# Patient Record
Sex: Male | Born: 1965 | Race: White | Hispanic: No | Marital: Married | Smoking: Current every day smoker
Health system: Southern US, Community
[De-identification: ages and names within clinical notes are randomized; demographics above are authoritative.]

## PROBLEM LIST (undated history)

## (undated) DIAGNOSIS — B182 Chronic viral hepatitis C: Secondary | ICD-10-CM

## (undated) DIAGNOSIS — I219 Acute myocardial infarction, unspecified: Secondary | ICD-10-CM

## (undated) DIAGNOSIS — Z9049 Acquired absence of other specified parts of digestive tract: Secondary | ICD-10-CM

## (undated) DIAGNOSIS — Z006 Encounter for examination for normal comparison and control in clinical research program: Principal | ICD-10-CM

## (undated) HISTORY — DX: Encounter for examination for normal comparison and control in clinical research program: Z00.6

## (undated) HISTORY — PX: CHOLECYSTECTOMY: SHX55

## (undated) HISTORY — DX: Acquired absence of other specified parts of digestive tract: Z90.49

## (undated) HISTORY — PX: MOUTH SURGERY: SHX715

## (undated) HISTORY — DX: Chronic viral hepatitis C: B18.2

---

## 2006-11-29 ENCOUNTER — Emergency Department (HOSPITAL_COMMUNITY): Admission: EM | Admit: 2006-11-29 | Discharge: 2006-11-29 | Payer: Self-pay | Admitting: Emergency Medicine

## 2007-01-09 ENCOUNTER — Emergency Department (HOSPITAL_COMMUNITY): Admission: EM | Admit: 2007-01-09 | Discharge: 2007-01-09 | Payer: Self-pay | Admitting: Emergency Medicine

## 2007-02-06 ENCOUNTER — Emergency Department (HOSPITAL_COMMUNITY): Admission: EM | Admit: 2007-02-06 | Discharge: 2007-02-06 | Payer: Self-pay | Admitting: Emergency Medicine

## 2007-04-25 ENCOUNTER — Emergency Department (HOSPITAL_COMMUNITY): Admission: EM | Admit: 2007-04-25 | Discharge: 2007-04-25 | Payer: Self-pay | Admitting: Emergency Medicine

## 2007-04-30 ENCOUNTER — Emergency Department (HOSPITAL_COMMUNITY): Admission: EM | Admit: 2007-04-30 | Discharge: 2007-04-30 | Payer: Self-pay | Admitting: Emergency Medicine

## 2007-05-08 ENCOUNTER — Emergency Department (HOSPITAL_COMMUNITY): Admission: EM | Admit: 2007-05-08 | Discharge: 2007-05-08 | Payer: Self-pay | Admitting: Emergency Medicine

## 2007-05-31 ENCOUNTER — Emergency Department (HOSPITAL_COMMUNITY): Admission: EM | Admit: 2007-05-31 | Discharge: 2007-06-01 | Payer: Self-pay | Admitting: Emergency Medicine

## 2008-01-13 ENCOUNTER — Emergency Department (HOSPITAL_COMMUNITY): Admission: EM | Admit: 2008-01-13 | Discharge: 2008-01-13 | Payer: Self-pay | Admitting: Emergency Medicine

## 2008-09-13 ENCOUNTER — Emergency Department (HOSPITAL_BASED_OUTPATIENT_CLINIC_OR_DEPARTMENT_OTHER): Admission: EM | Admit: 2008-09-13 | Discharge: 2008-09-13 | Payer: Self-pay | Admitting: Emergency Medicine

## 2008-09-27 ENCOUNTER — Emergency Department (HOSPITAL_COMMUNITY): Admission: EM | Admit: 2008-09-27 | Discharge: 2008-09-27 | Payer: Self-pay | Admitting: Emergency Medicine

## 2008-10-09 ENCOUNTER — Emergency Department (HOSPITAL_COMMUNITY): Admission: EM | Admit: 2008-10-09 | Discharge: 2008-10-09 | Payer: Self-pay | Admitting: Emergency Medicine

## 2011-01-09 LAB — POCT I-STAT, CHEM 8
BUN: 13 mg/dL (ref 6–23)
BUN: 19 mg/dL (ref 6–23)
Chloride: 107 mEq/L (ref 96–112)
Chloride: 108 mEq/L (ref 96–112)
Creatinine, Ser: 1.2 mg/dL (ref 0.4–1.5)
HCT: 48 % (ref 39.0–52.0)
Sodium: 139 mEq/L (ref 135–145)
Sodium: 142 mEq/L (ref 135–145)
TCO2: 23 mmol/L (ref 0–100)
TCO2: 25 mmol/L (ref 0–100)

## 2011-01-09 LAB — DIFFERENTIAL
Eosinophils Absolute: 0.2 10*3/uL (ref 0.0–0.7)
Lymphocytes Relative: 45 % (ref 12–46)
Lymphs Abs: 3.2 10*3/uL (ref 0.7–4.0)
Monocytes Relative: 5 % (ref 3–12)
Neutrophils Relative %: 47 % (ref 43–77)

## 2011-01-09 LAB — CBC
HCT: 47.8 % (ref 39.0–52.0)
MCV: 91.4 fL (ref 78.0–100.0)
RBC: 5.23 MIL/uL (ref 4.22–5.81)
WBC: 7.1 10*3/uL (ref 4.0–10.5)

## 2011-01-09 LAB — LIPASE, BLOOD: Lipase: 195 U/L — ABNORMAL HIGH (ref 11–59)

## 2011-01-09 LAB — HEPATIC FUNCTION PANEL
Albumin: 4 g/dL (ref 3.5–5.2)
Total Protein: 6.9 g/dL (ref 6.0–8.3)

## 2011-06-16 ENCOUNTER — Emergency Department (HOSPITAL_BASED_OUTPATIENT_CLINIC_OR_DEPARTMENT_OTHER)
Admission: EM | Admit: 2011-06-16 | Discharge: 2011-06-16 | Disposition: A | Discharge status: Home / Self Care | Payer: Self-pay | Attending: Emergency Medicine | Admitting: Emergency Medicine

## 2011-06-16 ENCOUNTER — Emergency Department (INDEPENDENT_AMBULATORY_CARE_PROVIDER_SITE_OTHER): Payer: Self-pay

## 2011-06-16 ENCOUNTER — Encounter: Payer: Self-pay | Admitting: *Deleted

## 2011-06-16 DIAGNOSIS — M549 Dorsalgia, unspecified: Secondary | ICD-10-CM | POA: Insufficient documentation

## 2011-06-16 DIAGNOSIS — S6980XA Other specified injuries of unspecified wrist, hand and finger(s), initial encounter: Secondary | ICD-10-CM

## 2011-06-16 DIAGNOSIS — S6990XA Unspecified injury of unspecified wrist, hand and finger(s), initial encounter: Secondary | ICD-10-CM

## 2011-06-16 DIAGNOSIS — X500XXA Overexertion from strenuous movement or load, initial encounter: Secondary | ICD-10-CM | POA: Insufficient documentation

## 2011-06-16 DIAGNOSIS — M79609 Pain in unspecified limb: Secondary | ICD-10-CM

## 2011-06-16 DIAGNOSIS — X58XXXA Exposure to other specified factors, initial encounter: Secondary | ICD-10-CM

## 2011-06-16 DIAGNOSIS — F17200 Nicotine dependence, unspecified, uncomplicated: Secondary | ICD-10-CM | POA: Insufficient documentation

## 2011-06-16 DIAGNOSIS — S62609A Fracture of unspecified phalanx of unspecified finger, initial encounter for closed fracture: Secondary | ICD-10-CM | POA: Insufficient documentation

## 2011-06-16 MED ORDER — HYDROCODONE-ACETAMINOPHEN 5-325 MG PO TABS: 2.0000 | ORAL_TABLET | ORAL | Status: AC | PRN

## 2011-06-16 NOTE — ED Notes (Signed)
Pt c/o lower back pain and right hand pain x 1 day

## 2011-06-16 NOTE — ED Notes (Signed)
Pt states he slipped while on ladder/no fall approx 1/5 hrs PTA-later injured right 5th finger with roofing gun-hx of chronic back pain-hx of fx to pinky finger approx 1 month ago-was wearing splint/not currently and no ortho f/u

## 2011-06-16 NOTE — ED Provider Notes (Signed)
History     CSN: [id number] Arrival date & time: 06/16/2011  2:36 PM  Chief Complaint  Patient presents with  . Back Pain  . Hand Pain    HPI  (Consider location/radiation/quality/duration/timing/severity/associated sxs/prior treatment)  Patient is a 45 y.o. male presenting with hand pain. The history is provided by the patient. No language interpreter was used.  Hand Pain This is a new problem. The current episode started today. The problem occurs constantly. The problem has been gradually worsening. Associated symptoms include joint swelling and myalgias. The symptoms are aggravated by nothing. He has tried nothing for the symptoms. The treatment provided moderate relief.  Pt reports he twist ed his back and hit finger today.  Pt complains of pain with movement of 5th finger.  History reviewed. No pertinent past medical history.  Past Surgical History  Procedure Date  . Cholecystectomy     History reviewed. No pertinent family history.  History  Substance Use Topics  . Smoking status: Current Everyday Smoker -- 0.5 packs/day  . Smokeless tobacco: Not on file  . Alcohol Use: No      Review of Systems  Review of Systems  Musculoskeletal: Positive for myalgias and joint swelling.  All other systems reviewed and are negative.    Allergies  Ultram  Home Medications  No current outpatient prescriptions on file.  Physical Exam    BP 123/92  Pulse 94  Temp 97.1 F (36.2 C)  Resp 16  Ht 6' (1.829 m)  Wt 230 lb (104.327 kg)  BMI 31.19 kg/m2  SpO2 100%  Physical Exam  Nursing note and vitals reviewed. Constitutional: He is oriented to person, place, and time. He appears well-developed and well-nourished.  HENT:  Head: Normocephalic and atraumatic.  Eyes: Pupils are equal, round, and reactive to light.  Neck: Normal range of motion.  Cardiovascular: Normal rate.   Pulmonary/Chest: Effort normal.  Musculoskeletal: He exhibits edema and tenderness.   Tender swollen right 5th finger,  From  LS spine,  From with pain,  Neurological: He is alert and oriented to person, place, and time.  Skin: Skin is warm and dry.  Psychiatric: He has a normal mood and affect.    ED Course  Procedures (including critical care time)  Labs Reviewed - No data to display No results found.   No diagnosis found.   MDM Pt has broken same finger in the past.  He did not follow up as directed for previous break and finger apears deformed.        Langston Masker, [place] 06/16/11 1637  Langston Masker, [place] 06/16/11 [phone]

## 2011-06-25 NOTE — ED Provider Notes (Signed)
Medical screening examination/treatment/procedure(s) were performed by non-physician practitioner and as supervising physician I was immediately available for consultation/collaboration.  Nelia Shi, MD 06/25/11 [phone]

## 2012-05-13 DIAGNOSIS — M25562 Pain in left knee: Secondary | ICD-10-CM | POA: Insufficient documentation

## 2013-10-26 ENCOUNTER — Emergency Department (HOSPITAL_BASED_OUTPATIENT_CLINIC_OR_DEPARTMENT_OTHER)
Admission: EM | Admit: 2013-10-26 | Discharge: 2013-10-26 | Disposition: A | Discharge status: Home / Self Care | Payer: BC Managed Care – PPO | Attending: Emergency Medicine | Admitting: Emergency Medicine

## 2013-10-26 ENCOUNTER — Emergency Department (HOSPITAL_BASED_OUTPATIENT_CLINIC_OR_DEPARTMENT_OTHER): Payer: BC Managed Care – PPO

## 2013-10-26 ENCOUNTER — Encounter (HOSPITAL_BASED_OUTPATIENT_CLINIC_OR_DEPARTMENT_OTHER): Payer: Self-pay | Admitting: Emergency Medicine

## 2013-10-26 DIAGNOSIS — F17200 Nicotine dependence, unspecified, uncomplicated: Secondary | ICD-10-CM | POA: Insufficient documentation

## 2013-10-26 DIAGNOSIS — I10 Essential (primary) hypertension: Secondary | ICD-10-CM | POA: Insufficient documentation

## 2013-10-26 DIAGNOSIS — S20219A Contusion of unspecified front wall of thorax, initial encounter: Secondary | ICD-10-CM | POA: Insufficient documentation

## 2013-10-26 DIAGNOSIS — R296 Repeated falls: Secondary | ICD-10-CM | POA: Insufficient documentation

## 2013-10-26 DIAGNOSIS — Y9373 Activity, racquet and hand sports: Secondary | ICD-10-CM | POA: Insufficient documentation

## 2013-10-26 DIAGNOSIS — Y92838 Other recreation area as the place of occurrence of the external cause: Secondary | ICD-10-CM

## 2013-10-26 DIAGNOSIS — R0602 Shortness of breath: Secondary | ICD-10-CM | POA: Insufficient documentation

## 2013-10-26 DIAGNOSIS — T148XXA Other injury of unspecified body region, initial encounter: Secondary | ICD-10-CM

## 2013-10-26 DIAGNOSIS — Y9239 Other specified sports and athletic area as the place of occurrence of the external cause: Secondary | ICD-10-CM | POA: Insufficient documentation

## 2013-10-26 DIAGNOSIS — R0789 Other chest pain: Secondary | ICD-10-CM

## 2013-10-26 MED ORDER — OXYCODONE-ACETAMINOPHEN 5-325 MG PO TABS
1.0000 | ORAL_TABLET | Freq: Once | ORAL | Status: AC
  Administered 2013-10-26: 1 via ORAL
  Filled 2013-10-26: qty 1

## 2013-10-26 MED ORDER — KETOROLAC TROMETHAMINE 30 MG/ML IJ SOLN
30.0000 mg | Freq: Once | INTRAMUSCULAR | Status: AC
  Administered 2013-10-26: 30 mg via INTRAMUSCULAR
  Filled 2013-10-26: qty 1

## 2013-10-26 MED ORDER — IBUPROFEN 600 MG PO TABS: 600.0000 mg | ORAL_TABLET | Freq: Four times a day (QID) | ORAL | Status: DC | PRN

## 2013-10-26 MED ORDER — OXYCODONE-ACETAMINOPHEN 5-325 MG PO TABS: 1.0000 | ORAL_TABLET | Freq: Four times a day (QID) | ORAL | Status: DC | PRN

## 2013-10-26 NOTE — ED Notes (Signed)
Returned from xray

## 2013-10-26 NOTE — Discharge Instructions (Signed)
Chest Wall Pain Chest wall pain is pain in or around the bones and muscles of your chest. It may take up to 6 weeks to get better. It may take longer if you must stay physically active in your work and activities.  CAUSES  Chest wall pain may happen on its own. However, it may be caused by:  A viral illness like the flu.  Injury.  Coughing.  Exercise.  Arthritis. Fibromyalgia.Contusion A contusion is a deep bruise. Contusions are the result of an injury that caused bleeding under the skin. The contusion may turn blue, purple, or yellow. Minor injuries will give you a painless contusion, but more severe contusions may stay painful and swollen for a few weeks.  CAUSES  A contusion is usually caused by a blow, trauma, or direct force to an area of the body. SYMPTOMS   Swelling and redness of the injured area.  Bruising of the injured area.  Tenderness and soreness of the injured area.  Pain. DIAGNOSIS  The diagnosis can be made by taking a history and physical exam. An X-ray, CT scan, or MRI may be needed to determine if there were any associated injuries, such as fractures. TREATMENT  Specific treatment will depend on what area of the body was injured. In general, the best treatment for a contusion is resting, icing, elevating, and applying cold compresses to the injured area. Over-the-counter medicines may also be recommended for pain control. Ask your caregiver what the best treatment is for your contusion. HOME CARE INSTRUCTIONS   Put ice on the injured area.  Put ice in a plastic bag.  Place a towel between your skin and the bag.  Leave the ice on for 15-20 minutes, 03-04 times a day.  Only take over-the-counter or prescription medicines for pain, discomfort, or fever as directed by your caregiver. Your caregiver may recommend avoiding anti-inflammatory medicines (aspirin, ibuprofen, and naproxen) for 48 hours because these medicines may increase bruising.  Rest the  injured area.  If possible, elevate the injured area to reduce swelling. SEEK IMMEDIATE MEDICAL CARE IF:   You have increased bruising or swelling.  You have pain that is getting worse.  Your swelling or pain is not relieved with medicines. MAKE SURE YOU:   Understand these instructions.  Will watch your condition.  Will get help right away if you are not doing well or get worse. Document Released: 06/21/2005 Document Revised: 12/04/2011 Document Reviewed: 07/17/2011 [facility] Patient Information 2014 [place], [place].    Shingles. HOME CARE INSTRUCTIONS   Avoid overtiring physical activity. Try not to strain or perform activities that cause pain. This includes any activities using your chest or your abdominal and side muscles, especially if heavy weights are used.  Put ice on the sore area.  Put ice in a plastic bag.  Place a towel between your skin and the bag.  Leave the ice on for 15-20 minutes per hour while awake for the first 2 days.  Only take over-the-counter or prescription medicines for pain, discomfort, or fever as directed by your caregiver. SEEK IMMEDIATE MEDICAL CARE IF:   Your pain increases, or you are very uncomfortable.  You have a fever.  Your chest pain becomes worse.  You have new, unexplained symptoms.  You have nausea or vomiting.  You feel sweaty or lightheaded.  You have a cough with phlegm (sputum), or you cough up blood. MAKE SURE YOU:   Understand these instructions.  Will watch your condition.  Will get help  right away if you are not doing well or get worse. Document Released: 09/11/2005 Document Revised: 12/04/2011 Document Reviewed: 05/08/2011 [facility] Patient Information 2014 [place], [place].

## 2013-10-26 NOTE — ED Notes (Signed)
Transported to xray 

## 2013-10-26 NOTE — ED Provider Notes (Signed)
CSN: [id number]     Arrival date & time 10/26/13  0242 History   First MD Initiated Contact with Patient 10/26/13 0249     Chief Complaint  Patient presents with  . Fall   (Consider location/radiation/quality/duration/timing/severity/associated sxs/prior Treatment) HPI  This is a 48 year old male who presents with chest pain. Patient states that he was playing tenderness again yesterday when he fell directly onto the right side of his chest. He denies loss of consciousness or other injury. Patient reports progressive right-sided chest pain. Currently 8/10.  It is worse with breathing. Patient has not taken any medications at home. He states that it finally got "too bad for him to take." Patient denies any anticoagulant use.  Past Medical History  Diagnosis Date  . Hypertension    Past Surgical History  Procedure Laterality Date  . Cholecystectomy     No family history on file. History  Substance Use Topics  . Smoking status: Current Every Day Smoker -- 0.50 packs/day  . Smokeless tobacco: Not on file  . Alcohol Use: No    Review of Systems  Constitutional: Negative.  Negative for fever.  Respiratory: Positive for shortness of breath.   Cardiovascular: Positive for chest pain.  Gastrointestinal: Negative.  Negative for abdominal pain.  Genitourinary: Negative.  Negative for dysuria.  Musculoskeletal: Negative for back pain.  Skin: Negative for wound.  Neurological: Negative for headaches.  All other systems reviewed and are negative.    Allergies  Ultram  Home Medications   Current Outpatient Rx  Name  Route  Sig  Dispense  Refill  . ibuprofen (ADVIL,MOTRIN) 600 MG tablet   Oral   Take 1 tablet (600 mg total) by mouth every 6 (six) hours as needed.   30 tablet   0   . oxyCODONE-acetaminophen (PERCOCET/ROXICET) 5-325 MG per tablet   Oral   Take 1 tablet by mouth every 6 (six) hours as needed for severe pain.   10 tablet   0    BP 137/89  Pulse 74  Temp(Src)  97.8 F (36.6 C) (Oral)  Resp 20  Ht 6' (1.829 m)  Wt 235 lb (106.595 kg)  BMI 31.86 kg/m2  SpO2 98% Physical Exam  Nursing note and vitals reviewed. Constitutional: He is oriented to person, place, and time. He appears well-developed and well-nourished. No distress.  HENT:  Head: Normocephalic and atraumatic.  Eyes: Pupils are equal, round, and reactive to light.  Neck: Neck supple.  Cardiovascular: Normal rate, regular rhythm and normal heart sounds.   No murmur heard. Pulmonary/Chest: Effort normal and breath sounds normal. No respiratory distress. He has no wheezes. He exhibits tenderness.  Tenderness palpation of the right inferior chest wall, no obvious ecchymosis   Abdominal: Soft. Bowel sounds are normal. There is no tenderness. There is no rebound.  Musculoskeletal: He exhibits no edema.  Lymphadenopathy:    He has no cervical adenopathy.  Neurological: He is alert and oriented to person, place, and time.  Skin: Skin is warm and dry.  Psychiatric: He has a normal mood and affect.    ED Course  Procedures (including critical care time) Labs Review Labs Reviewed - No data to display Imaging Review Dg Chest 2 View  10/26/2013   CLINICAL DATA:  Fall.  EXAM: CHEST  2 VIEW  COMPARISON:  None.  FINDINGS: Normal heart size and mediastinal contours. No acute infiltrate or edema. No effusion or pneumothorax. No acute osseous findings.  IMPRESSION: No active cardiopulmonary disease.   Electronically  Signed   By: Tiburcio Pea M.D.   On: 10/26/2013 04:03    EKG Interpretation   None       MDM   1. Right-sided chest wall pain   2. Contusion    Patient presents with right-sided chest pain following a fall. He is nontoxic-appearing on exam and vital signs are within normal limits. He is tenderness to palpation. Chest x-ray is negative for pneumothorax or other traumatic injury. Patient was given Norco and Toradol. He will be sent home with a short course of narcotic pain  medication.  After history, exam, and medical workup I feel the patient has been appropriately medically screened and is safe for discharge home. Pertinent diagnoses were discussed with the patient. Patient was given return precautions.     Shon Baton, MD 10/26/13 [phone]

## 2013-10-26 NOTE — ED Notes (Addendum)
States he was playing a game similar to tennis when he dove for the ball and fell on his right side hitting his right ribcage area. C/o pain to right ribcage area. Worse with breathing and movement. Denies any loc. Denies any other injury. No obvious injury noted on exam.

## 2013-10-29 ENCOUNTER — Encounter (HOSPITAL_BASED_OUTPATIENT_CLINIC_OR_DEPARTMENT_OTHER): Payer: Self-pay | Admitting: Emergency Medicine

## 2013-10-29 ENCOUNTER — Emergency Department (HOSPITAL_BASED_OUTPATIENT_CLINIC_OR_DEPARTMENT_OTHER)
Admission: EM | Admit: 2013-10-29 | Discharge: 2013-10-29 | Disposition: A | Discharge status: Home / Self Care | Payer: BC Managed Care – PPO | Attending: Emergency Medicine | Admitting: Emergency Medicine

## 2013-10-29 DIAGNOSIS — I252 Old myocardial infarction: Secondary | ICD-10-CM | POA: Insufficient documentation

## 2013-10-29 DIAGNOSIS — R1110 Vomiting, unspecified: Secondary | ICD-10-CM

## 2013-10-29 DIAGNOSIS — F17200 Nicotine dependence, unspecified, uncomplicated: Secondary | ICD-10-CM | POA: Insufficient documentation

## 2013-10-29 DIAGNOSIS — R109 Unspecified abdominal pain: Secondary | ICD-10-CM | POA: Insufficient documentation

## 2013-10-29 DIAGNOSIS — R197 Diarrhea, unspecified: Secondary | ICD-10-CM | POA: Insufficient documentation

## 2013-10-29 DIAGNOSIS — I10 Essential (primary) hypertension: Secondary | ICD-10-CM | POA: Insufficient documentation

## 2013-10-29 HISTORY — DX: Acute myocardial infarction, unspecified: I21.9

## 2013-10-29 LAB — CBC WITH DIFFERENTIAL/PLATELET
BASOS ABS: 0 10*3/uL (ref 0.0–0.1)
BASOS PCT: 0 % (ref 0–1)
Eosinophils Absolute: 0.1 10*3/uL (ref 0.0–0.7)
Eosinophils Relative: 1 % (ref 0–5)
HCT: 51.9 % (ref 39.0–52.0)
Hemoglobin: 17.8 g/dL — ABNORMAL HIGH (ref 13.0–17.0)
Lymphocytes Relative: 36 % (ref 12–46)
Lymphs Abs: 2.8 10*3/uL (ref 0.7–4.0)
MCH: 30.3 pg (ref 26.0–34.0)
MCHC: 34.3 g/dL (ref 30.0–36.0)
MCV: 88.3 fL (ref 78.0–100.0)
Monocytes Absolute: 0.7 10*3/uL (ref 0.1–1.0)
Monocytes Relative: 9 % (ref 3–12)
NEUTROS ABS: 4.4 10*3/uL (ref 1.7–7.7)
NEUTROS PCT: 54 % (ref 43–77)
Platelets: 217 10*3/uL (ref 150–400)
RBC: 5.88 MIL/uL — ABNORMAL HIGH (ref 4.22–5.81)
RDW: 14 % (ref 11.5–15.5)
WBC: 8 10*3/uL (ref 4.0–10.5)

## 2013-10-29 LAB — COMPREHENSIVE METABOLIC PANEL
ALBUMIN: 4.6 g/dL (ref 3.5–5.2)
ALK PHOS: 78 U/L (ref 39–117)
ALT: 83 U/L — AB (ref 0–53)
AST: 33 U/L (ref 0–37)
BILIRUBIN TOTAL: 0.7 mg/dL (ref 0.3–1.2)
BUN: 18 mg/dL (ref 6–23)
CHLORIDE: 103 meq/L (ref 96–112)
CO2: 21 mEq/L (ref 19–32)
Calcium: 9.6 mg/dL (ref 8.4–10.5)
Creatinine, Ser: 1.3 mg/dL (ref 0.50–1.35)
GFR calc Af Amer: 75 mL/min — ABNORMAL LOW (ref >90)
GFR calc non Af Amer: 65 mL/min — ABNORMAL LOW (ref >90)
Glucose, Bld: 106 mg/dL — ABNORMAL HIGH (ref 70–99)
POTASSIUM: 3.8 meq/L (ref 3.7–5.3)
SODIUM: 140 meq/L (ref 137–147)
Total Protein: 8 g/dL (ref 6.0–8.3)

## 2013-10-29 MED ORDER — ONDANSETRON HCL 4 MG/2ML IJ SOLN
4.0000 mg | Freq: Once | INTRAMUSCULAR | Status: AC
  Administered 2013-10-29: 4 mg via INTRAVENOUS
  Filled 2013-10-29: qty 2

## 2013-10-29 MED ORDER — LISINOPRIL 20 MG PO TABS: 20.0000 mg | ORAL_TABLET | Freq: Every day | ORAL | Status: DC

## 2013-10-29 MED ORDER — SODIUM CHLORIDE 0.9 % IV SOLN
Freq: Once | INTRAVENOUS | Status: AC
  Administered 2013-10-29: 17:00:00 via INTRAVENOUS

## 2013-10-29 MED ORDER — ONDANSETRON 4 MG PO TBDP: 4.0000 mg | ORAL_TABLET | Freq: Three times a day (TID) | ORAL | Status: DC | PRN

## 2013-10-29 NOTE — ED Notes (Signed)
C/o n/v/d x 3 days 

## 2013-10-29 NOTE — ED Notes (Signed)
Pt sts he has not taken BP meds in almost a year.

## 2013-10-29 NOTE — ED Provider Notes (Signed)
Medical screening examination/treatment/procedure(s) were performed by non-physician practitioner and as supervising physician I was immediately available for consultation/collaboration.  EKG Interpretation   None         Charles B. Bernette MayersSheldon, MD 10/29/13 1840

## 2013-10-29 NOTE — ED Provider Notes (Signed)
CSN: [id number]     Arrival date & time 10/29/13  1616 History   First MD Initiated Contact with Patient 10/29/13 1631     Chief Complaint  Patient presents with  . Diarrhea   (Consider location/radiation/quality/duration/timing/severity/associated sxs/prior Treatment) Patient is a 48 y.o. male presenting with diarrhea. The history is provided by the patient. No language interpreter was used.  Diarrhea Quality:  Watery Severity:  Severe Number of episodes:  Multiple episodes Duration:  3 days Progression:  Worsening Relieved by:  Nothing Worsened by:  Nothing tried Ineffective treatments:  None tried Associated symptoms: abdominal pain and vomiting   Risk factors: no sick contacts     Past Medical History  Diagnosis Date  . Hypertension   . MI (myocardial infarction)    Past Surgical History  Procedure Laterality Date  . Cholecystectomy     No family history on file. History  Substance Use Topics  . Smoking status: Current Every Day Smoker -- 0.50 packs/day  . Smokeless tobacco: Not on file  . Alcohol Use: No    Review of Systems  Gastrointestinal: Positive for vomiting, abdominal pain and diarrhea.  All other systems reviewed and are negative.    Allergies  Ultram  Home Medications   Current Outpatient Rx  Name  Route  Sig  Dispense  Refill  . ibuprofen (ADVIL,MOTRIN) 600 MG tablet   Oral   Take 1 tablet (600 mg total) by mouth every 6 (six) hours as needed.   30 tablet   0   . oxyCODONE-acetaminophen (PERCOCET/ROXICET) 5-325 MG per tablet   Oral   Take 1 tablet by mouth every 6 (six) hours as needed for severe pain.   10 tablet   0    BP 151/109  Pulse 102  Temp(Src) 98.2 F (36.8 C) (Oral)  Resp 18  Ht 6' (1.829 m)  Wt 235 lb (106.595 kg)  BMI 31.86 kg/m2  SpO2 97% Physical Exam  Nursing note and vitals reviewed. Constitutional: He appears well-developed and well-nourished.  HENT:  Head: Normocephalic.  Right Ear: External ear normal.   Left Ear: External ear normal.  Eyes: Conjunctivae and EOM are normal. Pupils are equal, round, and reactive to light.  Neck: Normal range of motion. Neck supple.  Cardiovascular: Normal rate and regular rhythm.   Pulmonary/Chest: Effort normal and breath sounds normal.  Abdominal: Soft. Bowel sounds are normal.  Musculoskeletal: Normal range of motion.  Neurological: He is alert.  Skin: Skin is warm.  Psychiatric: He has a normal mood and affect.    ED Course  Procedures (including critical care time) Labs Review Labs Reviewed  CBC WITH DIFFERENTIAL - Abnormal; Notable for the following:    RBC 5.88 (*)    Hemoglobin 17.8 (*)    All other components within normal limits  COMPREHENSIVE METABOLIC PANEL - Abnormal; Notable for the following:    Glucose, Bld 106 (*)    ALT 83 (*)    GFR calc non Af Amer 65 (*)    GFR calc Af Amer 75 (*)    All other components within normal limits   Imaging Review No results found.  EKG Interpretation   None       MDM  No diagnosis found. Pt given Iv fluids x 2 liters,   Labs wbc's 8.0   Alt evevated at 83.   Pt feels better.   Pt advised to drink plenty of liquids,   Zofran for nausea     Elson AreasLeslie K Leo Fray,  PA-C 10/29/13 1838

## 2013-10-29 NOTE — Discharge Instructions (Signed)
Diarrhea Diarrhea is frequent loose and watery bowel movements. It can cause you to feel weak and dehydrated. Dehydration can cause you to become tired and thirsty, have a dry mouth, and have decreased urination that often is dark yellow. Diarrhea is a sign of another problem, most often an infection that will not last long. In most cases, diarrhea typically lasts 2 3 days. However, it can last longer if it is a sign of something more serious. It is important to treat your diarrhea as directed by your caregive to lessen or prevent future episodes of diarrhea. CAUSES  Some common causes include:  Gastrointestinal infections caused by viruses, bacteria, or parasites.  Food poisoning or food allergies.  Certain medicines, such as antibiotics, chemotherapy, and laxatives.  Artificial sweeteners and fructose.  Digestive disorders. HOME CARE INSTRUCTIONS  Ensure adequate fluid intake (hydration): have 1 cup (8 oz) of fluid for each diarrhea episode. Avoid fluids that contain simple sugars or sports drinks, fruit juices, whole milk products, and sodas. Your urine should be clear or pale yellow if you are drinking enough fluids. Hydrate with an oral rehydration solution that you can purchase at pharmacies, retail stores, and online. You can prepare an oral rehydration solution at home by mixing the following ingredients together:    tsp table salt.   tsp baking soda.   tsp salt substitute containing potassium chloride.  1  tablespoons sugar.  1 L (34 oz) of water.  Certain foods and beverages may increase the speed at which food moves through the gastrointestinal (GI) tract. These foods and beverages should be avoided and include:  Caffeinated and alcoholic beverages.  High-fiber foods, such as raw fruits and vegetables, nuts, seeds, and whole grain breads and cereals.  Foods and beverages sweetened with sugar alcohols, such as xylitol, sorbitol, and mannitol.  Some foods may be well  tolerated and may help thicken stool including:  Starchy foods, such as rice, toast, pasta, low-sugar cereal, oatmeal, grits, baked potatoes, crackers, and bagels.  Bananas.  Applesauce.  Add probiotic-rich foods to help increase healthy bacteria in the GI tract, such as yogurt and fermented milk products.  Wash your hands well after each diarrhea episode.  Only take over-the-counter or prescription medicines as directed by your caregiver.  Take a warm bath to relieve any burning or pain from frequent diarrhea episodes. SEEK IMMEDIATE MEDICAL CARE IF:   You are unable to keep fluids down.  You have persistent vomiting.  You have blood in your stool, or your stools are black and tarry.  You do not urinate in 6 8 hours, or there is only a small amount of very dark urine.  You have abdominal pain that increases or localizes.  You have weakness, dizziness, confusion, or lightheadedness.  You have a severe headache.  Your diarrhea gets worse or does not get better.  You have a fever or persistent symptoms for more than 2 3 days.  You have a fever and your symptoms suddenly get worse. MAKE SURE YOU:   Understand these instructions.  Will watch your condition.  Will get help right away if you are not doing well or get worse. Document Released: 09/01/2002 Document Revised: 08/28/2012 Document Reviewed: 05/19/2012 [employer] Patient Information 2014 [employer].  

## 2013-12-29 ENCOUNTER — Emergency Department (HOSPITAL_BASED_OUTPATIENT_CLINIC_OR_DEPARTMENT_OTHER): Payer: BC Managed Care – PPO

## 2013-12-29 ENCOUNTER — Emergency Department (HOSPITAL_BASED_OUTPATIENT_CLINIC_OR_DEPARTMENT_OTHER)
Admission: EM | Admit: 2013-12-29 | Discharge: 2013-12-29 | Disposition: A | Discharge status: Home / Self Care | Payer: BC Managed Care – PPO | Attending: Emergency Medicine | Admitting: Emergency Medicine

## 2013-12-29 ENCOUNTER — Encounter (HOSPITAL_BASED_OUTPATIENT_CLINIC_OR_DEPARTMENT_OTHER): Payer: Self-pay | Admitting: Emergency Medicine

## 2013-12-29 DIAGNOSIS — Y9389 Activity, other specified: Secondary | ICD-10-CM | POA: Insufficient documentation

## 2013-12-29 DIAGNOSIS — Y929 Unspecified place or not applicable: Secondary | ICD-10-CM | POA: Insufficient documentation

## 2013-12-29 DIAGNOSIS — F17200 Nicotine dependence, unspecified, uncomplicated: Secondary | ICD-10-CM | POA: Insufficient documentation

## 2013-12-29 DIAGNOSIS — X500XXA Overexertion from strenuous movement or load, initial encounter: Secondary | ICD-10-CM | POA: Insufficient documentation

## 2013-12-29 DIAGNOSIS — S93602A Unspecified sprain of left foot, initial encounter: Secondary | ICD-10-CM

## 2013-12-29 DIAGNOSIS — I252 Old myocardial infarction: Secondary | ICD-10-CM | POA: Insufficient documentation

## 2013-12-29 DIAGNOSIS — S93609A Unspecified sprain of unspecified foot, initial encounter: Secondary | ICD-10-CM | POA: Insufficient documentation

## 2013-12-29 DIAGNOSIS — I10 Essential (primary) hypertension: Secondary | ICD-10-CM | POA: Insufficient documentation

## 2013-12-29 DIAGNOSIS — Z79899 Other long term (current) drug therapy: Secondary | ICD-10-CM | POA: Insufficient documentation

## 2013-12-29 MED ORDER — NAPROXEN 250 MG PO TABS: 500.0000 mg | ORAL_TABLET | Freq: Once | ORAL | Status: DC

## 2013-12-29 MED ORDER — HYDROCODONE-ACETAMINOPHEN 5-325 MG PO TABS: 1.0000 | ORAL_TABLET | Freq: Four times a day (QID) | ORAL | Status: DC | PRN

## 2013-12-29 MED ORDER — NAPROXEN SODIUM 550 MG PO TABS: ORAL_TABLET | ORAL | Status: DC

## 2013-12-29 NOTE — Discharge Instructions (Signed)
Foot Sprain The muscles and cord like structures which attach muscle to bone (tendons) that surround the feet are made up of units. A foot sprain can occur at the weakest spot in any of these units. This condition is most often caused by injury to or overuse of the foot, as from playing contact sports, or aggravating a previous injury, or from poor conditioning, or obesity. SYMPTOMS  Pain with movement of the foot.  Tenderness and swelling at the injury site.  Loss of strength is present in moderate or severe sprains. THE THREE GRADES OR SEVERITY OF FOOT SPRAIN ARE:  Mild (Grade I): Slightly pulled muscle without tearing of muscle or tendon fibers or loss of strength.  Moderate (Grade II): Tearing of fibers in a muscle, tendon, or at the attachment to bone, with small decrease in strength.  Severe (Grade III): Rupture of the muscle-tendon-bone attachment, with separation of fibers. Severe sprain requires surgical repair. Often repeating (chronic) sprains are caused by overuse. Sudden (acute) sprains are caused by direct injury or over-use. DIAGNOSIS  Diagnosis of this condition is usually by your own observation. If problems continue, a caregiver may be required for further evaluation and treatment. X-rays may be required to make sure there are not breaks in the bones (fractures) present. Continued problems may require physical therapy for treatment. PREVENTION  Use strength and conditioning exercises appropriate for your sport.  Warm up properly prior to working out.  Use athletic shoes that are made for the sport you are participating in.  Allow adequate time for healing. Early return to activities makes repeat injury more likely, and can lead to an unstable arthritic foot that can result in prolonged disability. Mild sprains generally heal in 3 to 10 days, with moderate and severe sprains taking 2 to 10 weeks. Your caregiver can help you determine the proper time required for  healing. HOME CARE INSTRUCTIONS   Apply ice to the injury for 15-20 minutes, 03-04 times per day. Put the ice in a plastic bag and place a towel between the bag of ice and your skin.  An elastic wrap (like an Ace bandage) may be used to keep swelling down.  Keep foot above the level of the heart, or at least raised on a footstool, when swelling and pain are present.  Try to avoid use other than gentle range of motion while the foot is painful. Do not resume use until instructed by your caregiver. Then begin use gradually, not increasing use to the point of pain. If pain does develop, decrease use and continue the above measures, gradually increasing activities that do not cause discomfort, until you gradually achieve normal use.  Use crutches if and as instructed, and for the length of time instructed.  Keep injured foot and ankle wrapped between treatments.  Massage foot and ankle for comfort and to keep swelling down. Massage from the toes up towards the knee.  Only take over-the-counter or prescription medicines for pain, discomfort, or fever as directed by your caregiver. SEEK IMMEDIATE MEDICAL CARE IF:   Your pain and swelling increase, or pain is not controlled with medications.  You have loss of feeling in your foot or your foot turns cold or blue.  You develop new, unexplained symptoms, or an increase of the symptoms that brought you to your caregiver. MAKE SURE YOU:   Understand these instructions.  Will watch your condition.  Will get help right away if you are not doing well or get worse. Document Released:   03/03/2002 Document Revised: 12/04/2011 Document Reviewed: 04/30/2008 [employer] Patient Information 2014 [employer].  

## 2013-12-29 NOTE — ED Provider Notes (Signed)
CSN: [id number]     Arrival date & time 12/29/13  0524 History   First MD Initiated Contact with Patient 12/29/13 [phone]     Chief Complaint  Patient presents with  . Foot Pain     (Consider location/radiation/quality/duration/timing/severity/associated sxs/prior Treatment) HPI This is a 48 year old male with a two-day history of pain in his left foot. The pain is located near the base of the fifth metatarsal. He is not sure if he injured it but thinks he may have stepped on a step wrong. He is having moderate to severe pain with ambulation. The pain is also severe enough to make sleeping difficult. He has noticed some swelling near the location of the pain. His ankle is not involved.  Past Medical History  Diagnosis Date  . Hypertension   . MI (myocardial infarction)    Past Surgical History  Procedure Laterality Date  . Cholecystectomy     History reviewed. No pertinent family history. History  Substance Use Topics  . Smoking status: Current Every Day Smoker -- 0.50 packs/day  . Smokeless tobacco: Not on file  . Alcohol Use: No    Review of Systems  All other systems reviewed and are negative.   Allergies  Ultram  Home Medications   Current Outpatient Rx  Name  Route  Sig  Dispense  Refill  . ibuprofen (ADVIL,MOTRIN) 600 MG tablet   Oral   Take 1 tablet (600 mg total) by mouth every 6 (six) hours as needed.   30 tablet   0   . lisinopril (PRINIVIL,ZESTRIL) 20 MG tablet   Oral   Take 1 tablet (20 mg total) by mouth daily.   30 tablet   0   . ondansetron (ZOFRAN ODT) 4 MG disintegrating tablet   Oral   Take 1 tablet (4 mg total) by mouth every 8 (eight) hours as needed for nausea or vomiting.   20 tablet   0   . oxyCODONE-acetaminophen (PERCOCET/ROXICET) 5-325 MG per tablet   Oral   Take 1 tablet by mouth every 6 (six) hours as needed for severe pain.   10 tablet   0    BP 152/95  Pulse 84  Temp(Src) 98 F (36.7 C) (Oral)  Resp 21  Ht 6' (1.829 m)   Wt 225 lb (102.059 kg)  BMI 30.51 kg/m2  SpO2 98%  Physical Exam General: Well-developed, well-nourished male in no acute distress; appearance consistent with age of record HENT: normocephalic; atraumatic Eyes: pupils equal, round and reactive to light; extraocular muscles intact Neck: supple Heart: regular rate and rhythm Lungs: clear to auscultation bilaterally Abdomen: soft; nondistended; nontender; bowel sounds present Extremities: No deformity; full range of motion; pulses normal; tenderness at base of left fifth metatarsal with mild edema superior to this, no erythema, warmth or ecchymosis; left ankle stable and nontender; left foot distally neurovascularly intact Neurologic: Awake, alert and oriented; motor function intact in all extremities and symmetric; no facial droop Skin: Warm and dry Psychiatric: Normal mood and affect    ED Course  Procedures (including critical care time)   MDM  Nursing notes and vitals signs, including pulse oximetry, reviewed.  Summary of this visit's results, reviewed by myself:  Imaging Studies: Dg Foot Complete Left  12/29/2013   CLINICAL DATA:  Left foot pain.  EXAM: LEFT FOOT - COMPLETE 3+ VIEW  COMPARISON:  None.  FINDINGS: There is no evidence of fracture or dislocation. The joint spaces are preserved. There is no evidence of talar subluxation;  the subtalar joint is unremarkable in appearance. Os peroneum fragments are noted.  No significant soft tissue abnormalities are seen.  IMPRESSION: 1. No evidence of fracture or dislocation. 2. Os peroneum fragments noted.   Electronically Signed   By: Roanna Raider M.D.   On: 12/29/2013 06:05        Hanley Seamen, MD 12/29/13 [phone]

## 2013-12-29 NOTE — ED Notes (Signed)
Pt reports left foot pain that he noticed Saturday while stepping up on stairs. Reports pain felt like a "catch" - states he almost lost his balance. Reports edema, decreased ROM.

## 2014-03-04 ENCOUNTER — Emergency Department (HOSPITAL_BASED_OUTPATIENT_CLINIC_OR_DEPARTMENT_OTHER)
Admission: EM | Admit: 2014-03-04 | Discharge: 2014-03-04 | Disposition: A | Discharge status: Home / Self Care | Payer: BC Managed Care – PPO | Attending: Emergency Medicine | Admitting: Emergency Medicine

## 2014-03-04 ENCOUNTER — Emergency Department (HOSPITAL_BASED_OUTPATIENT_CLINIC_OR_DEPARTMENT_OTHER): Payer: BC Managed Care – PPO

## 2014-03-04 ENCOUNTER — Encounter (HOSPITAL_BASED_OUTPATIENT_CLINIC_OR_DEPARTMENT_OTHER): Payer: Self-pay | Admitting: Emergency Medicine

## 2014-03-04 DIAGNOSIS — R519 Headache, unspecified: Secondary | ICD-10-CM

## 2014-03-04 DIAGNOSIS — R51 Headache: Secondary | ICD-10-CM | POA: Insufficient documentation

## 2014-03-04 DIAGNOSIS — R0982 Postnasal drip: Secondary | ICD-10-CM

## 2014-03-04 DIAGNOSIS — F17200 Nicotine dependence, unspecified, uncomplicated: Secondary | ICD-10-CM | POA: Insufficient documentation

## 2014-03-04 DIAGNOSIS — I10 Essential (primary) hypertension: Secondary | ICD-10-CM | POA: Insufficient documentation

## 2014-03-04 DIAGNOSIS — I252 Old myocardial infarction: Secondary | ICD-10-CM | POA: Insufficient documentation

## 2014-03-04 MED ORDER — DESLORATADINE 5 MG PO TABS
5.0000 mg | ORAL_TABLET | Freq: Every day | ORAL | Status: DC
Start: 2014-03-04 — End: 2017-08-23

## 2014-03-04 MED ORDER — DEXAMETHASONE SODIUM PHOSPHATE 10 MG/ML IJ SOLN
10.0000 mg | Freq: Once | INTRAMUSCULAR | Status: AC
  Administered 2014-03-04: 10 mg via INTRAMUSCULAR
  Filled 2014-03-04: qty 1

## 2014-03-04 MED ORDER — METOCLOPRAMIDE HCL 5 MG/ML IJ SOLN
10.0000 mg | Freq: Once | INTRAMUSCULAR | Status: AC
  Administered 2014-03-04: 10 mg via INTRAMUSCULAR
  Filled 2014-03-04: qty 2

## 2014-03-04 MED ORDER — NAPROXEN SODIUM 550 MG PO TABS: 550.0000 mg | ORAL_TABLET | Freq: Two times a day (BID) | ORAL | Status: DC

## 2014-03-04 MED ORDER — KETOROLAC TROMETHAMINE 60 MG/2ML IM SOLN
60.0000 mg | Freq: Once | INTRAMUSCULAR | Status: AC
  Administered 2014-03-04: 60 mg via INTRAMUSCULAR
  Filled 2014-03-04: qty 2

## 2014-03-04 MED ORDER — FLUTICASONE PROPIONATE 50 MCG/ACT NA SUSP: 2.0000 | Freq: Every day | NASAL | Status: DC

## 2014-03-04 NOTE — ED Notes (Signed)
Headache since Sunday after mowing the yard, denies N/V, no blurred vision

## 2014-03-04 NOTE — ED Notes (Signed)
MD at bedside. 

## 2014-03-04 NOTE — ED Notes (Signed)
Patient transported to CT 

## 2014-03-04 NOTE — ED Provider Notes (Signed)
CSN: [id number]     Arrival date & time 03/04/14  [id number] History   None    Chief Complaint  Patient presents with  . Headache     (Consider location/radiation/quality/duration/timing/severity/associated sxs/prior Treatment) Patient is a 48 y.o. male presenting with headaches. The history is provided by the patient.  Headache Pain location:  R parietal Quality:  Dull Radiates to:  Does not radiate Severity currently:  8/10 Severity at highest:  8/10 Onset quality:  Gradual Timing:  Constant Progression:  Unchanged Chronicity:  Recurrent Context: not activity and not coughing   Relieved by:  Nothing Worsened by:  Nothing tried Ineffective treatments:  None tried Associated symptoms: sinus pressure   Associated symptoms: no dizziness, no fever, no focal weakness, no myalgias, no near-syncope, no neck stiffness, no numbness, no photophobia, no seizures, no sore throat, no visual change and no vomiting   Risk factors: no anger     Past Medical History  Diagnosis Date  . Hypertension   . MI (myocardial infarction)    Past Surgical History  Procedure Laterality Date  . Cholecystectomy     History reviewed. No pertinent family history. History  Substance Use Topics  . Smoking status: Current Every Day Smoker -- 0.50 packs/day  . Smokeless tobacco: Not on file  . Alcohol Use: No    Review of Systems  Constitutional: Negative for fever.  HENT: Positive for rhinorrhea and sinus pressure. Negative for sore throat.   Eyes: Negative for photophobia and visual disturbance.  Cardiovascular: Negative for near-syncope.  Gastrointestinal: Negative for vomiting.  Musculoskeletal: Negative for myalgias and neck stiffness.  Neurological: Positive for headaches. Negative for dizziness, focal weakness, seizures, syncope, facial asymmetry, weakness and numbness.  All other systems reviewed and are negative.     Allergies  Ultram  Home Medications   Prior to Admission medications    Medication Sig Start Date End Date Taking? Authorizing Provider  lisinopril (PRINIVIL,ZESTRIL) 20 MG tablet Take 1 tablet (20 mg total) by mouth daily. 10/29/13  Yes Elson AreasLeslie K Sofia, PA-C  HYDROcodone-acetaminophen (NORCO/VICODIN) 5-325 MG per tablet Take 1-2 tablets by mouth every 6 (six) hours as needed for moderate pain. 12/29/13   Carlisle BeersJohn L Molpus, MD  ibuprofen (ADVIL,MOTRIN) 600 MG tablet Take 1 tablet (600 mg total) by mouth every 6 (six) hours as needed. 10/26/13   Shon Batonourtney F Horton, MD  naproxen sodium (ANAPROX DS) 550 MG tablet Take 1 tablet every 12 hours as needed for foot pain. Best taken with a meal. 12/29/13   Carlisle BeersJohn L Molpus, MD  ondansetron (ZOFRAN ODT) 4 MG disintegrating tablet Take 1 tablet (4 mg total) by mouth every 8 (eight) hours as needed for nausea or vomiting. 10/29/13   Elson AreasLeslie K Sofia, PA-C  oxyCODONE-acetaminophen (PERCOCET/ROXICET) 5-325 MG per tablet Take 1 tablet by mouth every 6 (six) hours as needed for severe pain. 10/26/13   Shon Batonourtney F Horton, MD   BP 143/98  Pulse 80  Temp(Src) 97.8 F (36.6 C) (Oral)  Resp 18  SpO2 100% Physical Exam  Constitutional: He is oriented to person, place, and time. He appears well-developed and well-nourished. No distress.  HENT:  Head: Normocephalic and atraumatic.  Right Ear: External ear normal.  Left Ear: External ear normal.  Mouth/Throat: Oropharynx is clear and moist. No oropharyngeal exudate.  Clear colorless post nasal drainage with cobblestoning  Eyes: Conjunctivae and EOM are normal. Pupils are equal, round, and reactive to light.  Neck: Normal range of motion. Neck supple.  Cardiovascular: Normal  rate, regular rhythm and intact distal pulses.   Pulmonary/Chest: Effort normal and breath sounds normal. He has no wheezes. He has no rales.  Abdominal: Soft. Bowel sounds are normal. There is no tenderness. There is no rebound and no guarding.  Musculoskeletal: Normal range of motion.  Lymphadenopathy:    He has no cervical  adenopathy.  Neurological: He is alert and oriented to person, place, and time. He has normal reflexes. No cranial nerve deficit. Coordination normal.  Gait intact  Skin: Skin is warm and dry. He is not diaphoretic.  Psychiatric: He has a normal mood and affect.    ED Course  Procedures (including critical care time) Labs Review Labs Reviewed - No data to display  Imaging Review Ct Head Wo Contrast  03/04/2014   CLINICAL DATA:  Persistent headache.  EXAM: CT HEAD WITHOUT CONTRAST  TECHNIQUE: Contiguous axial images were obtained from the base of the skull through the vertex without intravenous contrast.  COMPARISON:  CT of the head November 29, 2006  FINDINGS: The ventricles and sulci are normal. No intraparenchymal hemorrhage, mass effect nor midline shift. No acute large vascular territory infarcts.  No abnormal extra-axial fluid collections. Basal cisterns are patent.  No skull fracture. The included ocular globes and orbital contents are non-suspicious. The mastoid aircells and included paranasal sinuses are well-aerated.  IMPRESSION: No acute intracranial process.  Normal noncontrast CT of the head.   Electronically Signed   By: Awilda Metro   On: 03/04/2014 05:52     EKG Interpretation None      MDM   Final diagnoses:  None    Post nasal drip, as started while mowing lawn suspect allergic mediated.  Will treat for post nasal drip and allergies.  No indication for LP.  No signs of dural sinus thrombosis.  Return for fevers neck stiffness focal weakness or numbness or difficulty making speech    Joley Utecht K Roman Dubuc-Rasch, MD 03/04/14 [phone]

## 2014-03-05 ENCOUNTER — Encounter (HOSPITAL_BASED_OUTPATIENT_CLINIC_OR_DEPARTMENT_OTHER): Payer: Self-pay | Admitting: Emergency Medicine

## 2014-03-05 ENCOUNTER — Emergency Department (HOSPITAL_BASED_OUTPATIENT_CLINIC_OR_DEPARTMENT_OTHER)
Admission: EM | Admit: 2014-03-05 | Discharge: 2014-03-05 | Disposition: A | Discharge status: Home / Self Care | Payer: BC Managed Care – PPO | Attending: Emergency Medicine | Admitting: Emergency Medicine

## 2014-03-05 DIAGNOSIS — IMO0002 Reserved for concepts with insufficient information to code with codable children: Secondary | ICD-10-CM | POA: Insufficient documentation

## 2014-03-05 DIAGNOSIS — Z79899 Other long term (current) drug therapy: Secondary | ICD-10-CM | POA: Insufficient documentation

## 2014-03-05 DIAGNOSIS — Z791 Long term (current) use of non-steroidal anti-inflammatories (NSAID): Secondary | ICD-10-CM | POA: Insufficient documentation

## 2014-03-05 DIAGNOSIS — R519 Headache, unspecified: Secondary | ICD-10-CM

## 2014-03-05 DIAGNOSIS — F17200 Nicotine dependence, unspecified, uncomplicated: Secondary | ICD-10-CM | POA: Insufficient documentation

## 2014-03-05 DIAGNOSIS — R51 Headache: Secondary | ICD-10-CM | POA: Insufficient documentation

## 2014-03-05 DIAGNOSIS — I252 Old myocardial infarction: Secondary | ICD-10-CM | POA: Insufficient documentation

## 2014-03-05 DIAGNOSIS — I10 Essential (primary) hypertension: Secondary | ICD-10-CM | POA: Insufficient documentation

## 2014-03-05 MED ORDER — KETOROLAC TROMETHAMINE 60 MG/2ML IM SOLN
60.0000 mg | Freq: Once | INTRAMUSCULAR | Status: AC
  Administered 2014-03-05: 60 mg via INTRAMUSCULAR
  Filled 2014-03-05: qty 2

## 2014-03-05 NOTE — Discharge Instructions (Signed)
You can buy Claritin, aleve and Flonase over the counter to substitute for the prescripion medications.    Headaches, Frequently Asked Questions MIGRAINE HEADACHES Q: What is migraine? What causes it? How can I treat it? A: Generally, migraine headaches begin as a dull ache. Then they develop into a constant, throbbing, and pulsating pain. You may experience pain at the temples. You may experience pain at the front or back of one or both sides of the head. The pain is usually accompanied by a combination of:  Nausea.  Vomiting.  Sensitivity to light and noise. Some people (about 15%) experience an aura (see below) before an attack. The cause of migraine is believed to be chemical reactions in the brain. Treatment for migraine may include over-the-counter or prescription medications. It may also include self-help techniques. These include relaxation training and biofeedback.  Q: What is an aura? A: About 15% of people with migraine get an "aura". This is a sign of neurological symptoms that occur before a migraine headache. You may see wavy or jagged lines, dots, or flashing lights. You might experience tunnel vision or blind spots in one or both eyes. The aura can include visual or auditory hallucinations (something imagined). It may include disruptions in smell (such as strange odors), taste or touch. Other symptoms include:  Numbness.  A "pins and needles" sensation.  Difficulty in recalling or speaking the correct word. These neurological events may last as long as 60 minutes. These symptoms will fade as the headache begins. Q: What is a trigger? A: Certain physical or environmental factors can lead to or "trigger" a migraine. These include:  Foods.  Hormonal changes.  Weather.  Stress. It is important to remember that triggers are different for everyone. To help prevent migraine attacks, you need to figure out which triggers affect you. Keep a headache diary. This is a good way to  track triggers. The diary will help you talk to your healthcare professional about your condition. Q: Does weather affect migraines? A: Bright sunshine, hot, humid conditions, and drastic changes in barometric pressure may lead to, or "trigger," a migraine attack in some people. But studies have shown that weather does not act as a trigger for everyone with migraines. Q: What is the link between migraine and hormones? A: Hormones start and regulate many of your body's functions. Hormones keep your body in balance within a constantly changing environment. The levels of hormones in your body are unbalanced at times. Examples are during menstruation, pregnancy, or menopause. That can lead to a migraine attack. In fact, about three quarters of all women with migraine report that their attacks are related to the menstrual cycle.  Q: Is there an increased risk of stroke for migraine sufferers? A: The likelihood of a migraine attack causing a stroke is very remote. That is not to say that migraine sufferers cannot have a stroke associated with their migraines. In persons under age 46, the most common associated factor for stroke is migraine headache. But over the course of a person's normal life span, the occurrence of migraine headache may actually be associated with a reduced risk of dying from cerebrovascular disease due to stroke.  Q: What are acute medications for migraine? A: Acute medications are used to treat the pain of the headache after it has started. Examples over-the-counter medications, NSAIDs, ergots, and triptans.  Q: What are the triptans? A: Triptans are the newest class of abortive medications. They are specifically targeted to treat migraine. Triptans are  vasoconstrictors. They moderate some chemical reactions in the brain. The triptans work on receptors in your brain. Triptans help to restore the balance of a neurotransmitter called serotonin. Fluctuations in levels of serotonin are thought  to be a main cause of migraine.  Q: Are over-the-counter medications for migraine effective? A: Over-the-counter, or "OTC," medications may be effective in relieving mild to moderate pain and associated symptoms of migraine. But you should see your caregiver before beginning any treatment regimen for migraine.  Q: What are preventive medications for migraine? A: Preventive medications for migraine are sometimes referred to as "prophylactic" treatments. They are used to reduce the frequency, severity, and length of migraine attacks. Examples of preventive medications include antiepileptic medications, antidepressants, beta-blockers, calcium channel blockers, and NSAIDs (nonsteroidal anti-inflammatory drugs). Q: Why are anticonvulsants used to treat migraine? A: During the past few years, there has been an increased interest in antiepileptic drugs for the prevention of migraine. They are sometimes referred to as "anticonvulsants". Both epilepsy and migraine may be caused by similar reactions in the brain.  Q: Why are antidepressants used to treat migraine? A: Antidepressants are typically used to treat people with depression. They may reduce migraine frequency by regulating chemical levels, such as serotonin, in the brain.  Q: What alternative therapies are used to treat migraine? A: The term "alternative therapies" is often used to describe treatments considered outside the scope of conventional Western medicine. Examples of alternative therapy include acupuncture, acupressure, and yoga. Another common alternative treatment is herbal therapy. Some herbs are believed to relieve headache pain. Always discuss alternative therapies with your caregiver before proceeding. Some herbal products contain arsenic and other toxins. TENSION HEADACHES Q: What is a tension-type headache? What causes it? How can I treat it? A: Tension-type headaches occur randomly. They are often the result of temporary stress, anxiety,  fatigue, or anger. Symptoms include soreness in your temples, a tightening band-like sensation around your head (a "vice-like" ache). Symptoms can also include a pulling feeling, pressure sensations, and contracting head and neck muscles. The headache begins in your forehead, temples, or the back of your head and neck. Treatment for tension-type headache may include over-the-counter or prescription medications. Treatment may also include self-help techniques such as relaxation training and biofeedback. CLUSTER HEADACHES Q: What is a cluster headache? What causes it? How can I treat it? A: Cluster headache gets its name because the attacks come in groups. The pain arrives with little, if any, warning. It is usually on one side of the head. A tearing or bloodshot eye and a runny nose on the same side of the headache may also accompany the pain. Cluster headaches are believed to be caused by chemical reactions in the brain. They have been described as the most severe and intense of any headache type. Treatment for cluster headache includes prescription medication and oxygen. SINUS HEADACHES Q: What is a sinus headache? What causes it? How can I treat it? A: When a cavity in the bones of the face and skull (a sinus) becomes inflamed, the inflammation will cause localized pain. This condition is usually the result of an allergic reaction, a tumor, or an infection. If your headache is caused by a sinus blockage, such as an infection, you will probably have a fever. An x-ray will confirm a sinus blockage. Your caregiver's treatment might include antibiotics for the infection, as well as antihistamines or decongestants.  REBOUND HEADACHES Q: What is a rebound headache? What causes it? How can I treat it?  A: A pattern of taking acute headache medications too often can lead to a condition known as "rebound headache." A pattern of taking too much headache medication includes taking it more than 2 days per week or in  excessive amounts. That means more than the label or a caregiver advises. With rebound headaches, your medications not only stop relieving pain, they actually begin to cause headaches. Doctors treat rebound headache by tapering the medication that is being overused. Sometimes your caregiver will gradually substitute a different type of treatment or medication. Stopping may be a challenge. Regularly overusing a medication increases the potential for serious side effects. Consult a caregiver if you regularly use headache medications more than 2 days per week or more than the label advises. ADDITIONAL QUESTIONS AND ANSWERS Q: What is biofeedback? A: Biofeedback is a self-help treatment. Biofeedback uses special equipment to monitor your body's involuntary physical responses. Biofeedback monitors:  Breathing.  Pulse.  Heart rate.  Temperature.  Muscle tension.  Brain activity. Biofeedback helps you refine and perfect your relaxation exercises. You learn to control the physical responses that are related to stress. Once the technique has been mastered, you do not need the equipment any more. Q: Are headaches hereditary? A: Four out of five (80%) of people that suffer report a family history of migraine. Scientists are not sure if this is genetic or a family predisposition. Despite the uncertainty, a child has a 50% chance of having migraine if one parent suffers. The child has a 75% chance if both parents suffer.  Q: Can children get headaches? A: By the time they reach high school, most young people have experienced some type of headache. Many safe and effective approaches or medications can prevent a headache from occurring or stop it after it has begun.  Q: What type of doctor should I see to diagnose and treat my headache? A: Start with your primary caregiver. Discuss his or her experience and approach to headaches. Discuss methods of classification, diagnosis, and treatment. Your caregiver may  decide to recommend you to a headache specialist, depending upon your symptoms or other physical conditions. Having diabetes, allergies, etc., may require a more comprehensive and inclusive approach to your headache. The [employer] will provide, upon request, a list of [facility] physician members in your state. Document Released: 12/02/2003 Document Revised: 12/04/2011 Document Reviewed: 05/11/2008 [facility] Patient Information 2014 [place], [place].

## 2014-03-05 NOTE — ED Notes (Signed)
Headache since Sunday.  Seen in ED yesterday for same but is not better and needs a work note.  He was unable to fill Rx.

## 2014-03-05 NOTE — ED Provider Notes (Signed)
CSN: [id number]     Arrival date & time 03/05/14  1316 History   First MD Initiated Contact with Patient 03/05/14 1336     Chief Complaint  Patient presents with  . Headache     (Consider location/radiation/quality/duration/timing/severity/associated sxs/prior Treatment) Patient is a 48 y.o. male presenting with headaches. The history is provided by the patient. No language interpreter was used.  Headache Pain location:  Generalized Radiates to:  Does not radiate Severity currently:  5/10 Timing:  Constant Progression:  Worsening Chronicity:  New Relieved by:  Nothing Worsened by:  Nothing tried Ineffective treatments:  None tried Risk factors: no anger   Pt still has a headache.   Employer made him come in today for a work note  Past Medical History  Diagnosis Date  . Hypertension   . MI (myocardial infarction)    Past Surgical History  Procedure Laterality Date  . Cholecystectomy     No family history on file. History  Substance Use Topics  . Smoking status: Current Every Day Smoker -- 0.50 packs/day    Types: Cigarettes  . Smokeless tobacco: Not on file  . Alcohol Use: No    Review of Systems  Neurological: Positive for headaches.  All other systems reviewed and are negative.     Allergies  Ultram  Home Medications   Prior to Admission medications   Medication Sig Start Date End Date Taking? Authorizing Provider  desloratadine (CLARINEX) 5 MG tablet Take 1 tablet (5 mg total) by mouth daily. 03/04/14   April K Palumbo-Rasch, MD  fluticasone [facility](FLONASE) 50 MCG/ACT nasal spray Place 2 sprays into both nostrils daily. 03/04/14   April Smitty CordsK Palumbo-Rasch, MD  HYDROcodone-acetaminophen (NORCO/VICODIN) 5-325 MG per tablet Take 1-2 tablets by mouth every 6 (six) hours as needed for moderate pain. 12/29/13   Carlisle BeersJohn L Molpus, MD  ibuprofen (ADVIL,MOTRIN) 600 MG tablet Take 1 tablet (600 mg total) by mouth every 6 (six) hours as needed. 10/26/13   Shon Batonourtney F Horton, MD   lisinopril (PRINIVIL,ZESTRIL) 20 MG tablet Take 1 tablet (20 mg total) by mouth daily. 10/29/13   Elson AreasLeslie K Sofia, PA-C  naproxen sodium (ANAPROX DS) 550 MG tablet Take 1 tablet every 12 hours as needed for foot pain. Best taken with a meal. 12/29/13   Carlisle BeersJohn L Molpus, MD  naproxen sodium (ANAPROX DS) 550 MG tablet Take 1 tablet (550 mg total) by mouth 2 (two) times daily with a meal. 03/04/14   April K Palumbo-Rasch, MD  ondansetron (ZOFRAN ODT) 4 MG disintegrating tablet Take 1 tablet (4 mg total) by mouth every 8 (eight) hours as needed for nausea or vomiting. 10/29/13   Elson AreasLeslie K Sofia, PA-C  oxyCODONE-acetaminophen (PERCOCET/ROXICET) 5-325 MG per tablet Take 1 tablet by mouth every 6 (six) hours as needed for severe pain. 10/26/13   Shon Batonourtney F Horton, MD   BP 133/84  Pulse 98  Temp(Src) 98 F (36.7 C) (Oral)  Resp 16  Ht 6' (1.829 m)  Wt 225 lb (102.059 kg)  BMI 30.51 kg/m2  SpO2 96% Physical Exam  Nursing note and vitals reviewed. Constitutional: He is oriented to person, place, and time. He appears well-developed and well-nourished.  HENT:  Head: Normocephalic.  Right Ear: External ear normal.  Eyes: EOM are normal. Pupils are equal, round, and reactive to light.  Neck: Normal range of motion.  Cardiovascular: Normal rate.   Pulmonary/Chest: Effort normal.  Abdominal: Soft. He exhibits no distension.  Musculoskeletal: Normal range of motion.  Neurological: He  is alert and oriented to person, place, and time.  Skin: Skin is warm.  Psychiatric: He has a normal mood and affect.    ED Course  Procedures (including critical care time) Labs Review Labs Reviewed - No data to display  Imaging Review Ct Head Wo Contrast  03/04/2014   CLINICAL DATA:  Persistent headache.  EXAM: CT HEAD WITHOUT CONTRAST  TECHNIQUE: Contiguous axial images were obtained from the base of the skull through the vertex without intravenous contrast.  COMPARISON:  CT of the head November 29, 2006  FINDINGS: The  ventricles and sulci are normal. No intraparenchymal hemorrhage, mass effect nor midline shift. No acute large vascular territory infarcts.  No abnormal extra-axial fluid collections. Basal cisterns are patent.  No skull fracture. The included ocular globes and orbital contents are non-suspicious. The mastoid aircells and included paranasal sinuses are well-aerated.  IMPRESSION: No acute intracranial process.  Normal noncontrast CT of the head.   Electronically Signed   By: Awilda Metro   On: 03/04/2014 05:52     EKG Interpretation None      MDM   Final diagnoses:  Headache   Pt given torodol IM,   Pt counseled on rxs and otc medications    Elson Areas, PA-C 03/05/14 1425

## 2014-03-06 NOTE — ED Provider Notes (Signed)
Medical screening examination/treatment/procedure(s) were performed by non-physician practitioner and as supervising physician I was immediately available for consultation/collaboration.   EKG Interpretation None        Gwyneth SproutWhitney Ellah Otte, MD 03/06/14 1506

## 2014-07-09 ENCOUNTER — Encounter (HOSPITAL_BASED_OUTPATIENT_CLINIC_OR_DEPARTMENT_OTHER): Payer: Self-pay | Admitting: Emergency Medicine

## 2014-07-09 ENCOUNTER — Emergency Department (HOSPITAL_BASED_OUTPATIENT_CLINIC_OR_DEPARTMENT_OTHER)
Admission: EM | Admit: 2014-07-09 | Discharge: 2014-07-09 | Disposition: A | Discharge status: Home / Self Care | Payer: BC Managed Care – PPO | Attending: Emergency Medicine | Admitting: Emergency Medicine

## 2014-07-09 DIAGNOSIS — Z720 Tobacco use: Secondary | ICD-10-CM | POA: Insufficient documentation

## 2014-07-09 DIAGNOSIS — I252 Old myocardial infarction: Secondary | ICD-10-CM | POA: Insufficient documentation

## 2014-07-09 DIAGNOSIS — Z791 Long term (current) use of non-steroidal anti-inflammatories (NSAID): Secondary | ICD-10-CM | POA: Insufficient documentation

## 2014-07-09 DIAGNOSIS — Z79899 Other long term (current) drug therapy: Secondary | ICD-10-CM | POA: Insufficient documentation

## 2014-07-09 DIAGNOSIS — Z7952 Long term (current) use of systemic steroids: Secondary | ICD-10-CM | POA: Insufficient documentation

## 2014-07-09 DIAGNOSIS — I10 Essential (primary) hypertension: Secondary | ICD-10-CM | POA: Insufficient documentation

## 2014-07-09 DIAGNOSIS — K649 Unspecified hemorrhoids: Secondary | ICD-10-CM | POA: Insufficient documentation

## 2014-07-09 MED ORDER — HYDROCORTISONE 2.5 % RE CREA: TOPICAL_CREAM | RECTAL | Status: DC

## 2014-07-09 NOTE — ED Provider Notes (Signed)
CSN: [id number]     Arrival date & time 07/09/14  0423 History   First MD Initiated Contact with Patient 07/09/14 0444     Chief Complaint  Patient presents with  . Hemorrhoids     (Consider location/radiation/quality/duration/timing/severity/associated sxs/prior Treatment) HPI Comments: Patient is a 48 year old male who presents with complaints of swelling and pain near his rectum. He believes this is a hemorrhoid. He denies constipation, bloody stool. He denies fevers.  The history is provided by the patient.    Past Medical History  Diagnosis Date  . Hypertension   . MI (myocardial infarction)    Past Surgical History  Procedure Laterality Date  . Cholecystectomy     No family history on file. History  Substance Use Topics  . Smoking status: Current Every Day Smoker -- 0.50 packs/day    Types: Cigarettes  . Smokeless tobacco: Not on file  . Alcohol Use: No    Review of Systems  All other systems reviewed and are negative.     Allergies  Ultram  Home Medications   Prior to Admission medications   Medication Sig Start Date End Date Taking? Authorizing Provider  ibuprofen (ADVIL,MOTRIN) 600 MG tablet Take 1 tablet (600 mg total) by mouth every 6 (six) hours as needed. 10/26/13  Yes Shon Batonourtney F Horton, MD  lisinopril (PRINIVIL,ZESTRIL) 20 MG tablet Take 1 tablet (20 mg total) by mouth daily. 10/29/13  Yes Lonia SkinnerLeslie K Sofia, PA-C  desloratadine (CLARINEX) 5 MG tablet Take 1 tablet (5 mg total) by mouth daily. 03/04/14   April K Palumbo-Rasch, MD  fluticasone [facility](FLONASE) 50 MCG/ACT nasal spray Place 2 sprays into both nostrils daily. 03/04/14   April Smitty CordsK Palumbo-Rasch, MD  HYDROcodone-acetaminophen (NORCO/VICODIN) 5-325 MG per tablet Take 1-2 tablets by mouth every 6 (six) hours as needed for moderate pain. 12/29/13   Carlisle BeersJohn L Molpus, MD  naproxen sodium (ANAPROX DS) 550 MG tablet Take 1 tablet every 12 hours as needed for foot pain. Best taken with a meal. 12/29/13   Carlisle BeersJohn L Molpus, MD   naproxen sodium (ANAPROX DS) 550 MG tablet Take 1 tablet (550 mg total) by mouth 2 (two) times daily with a meal. 03/04/14   April K Palumbo-Rasch, MD  ondansetron (ZOFRAN ODT) 4 MG disintegrating tablet Take 1 tablet (4 mg total) by mouth every 8 (eight) hours as needed for nausea or vomiting. 10/29/13   Elson AreasLeslie K Sofia, PA-C  oxyCODONE-acetaminophen (PERCOCET/ROXICET) 5-325 MG per tablet Take 1 tablet by mouth every 6 (six) hours as needed for severe pain. 10/26/13   Shon Batonourtney F Horton, MD   BP 127/92  Pulse 90  Temp(Src) 98.6 F (37 C) (Oral)  Resp 20  Ht 6' (1.829 m)  Wt 220 lb (99.791 kg)  BMI 29.83 kg/m2  SpO2 98% Physical Exam  Nursing note and vitals reviewed. Constitutional: He is oriented to person, place, and time. He appears well-developed and well-nourished. No distress.  HENT:  Head: Normocephalic and atraumatic.  Neck: Normal range of motion. Neck supple.  Genitourinary:  There is a hemorrhoid noted to the left of the anus. There appears to be no thrombosis or bleeding. It is tender to the touch.  Neurological: He is alert and oriented to person, place, and time.  Skin: Skin is warm and dry. He is not diaphoretic.    ED Course  Procedures (including critical care time) Labs Review Labs Reviewed - No data to display  Imaging Review No results found.   EKG Interpretation None  MDM   Final diagnoses:  None    Will recommend alternating Preparation H and steroid cream. Return as needed.    Geoffery Lyonsouglas Yasmen Cortner, MD 07/09/14 [phone]

## 2014-07-09 NOTE — Discharge Instructions (Signed)
Alternate Preparation H and Anusol cream every 4 hours.  Followup with your primary Dr. if not improving in the next several days.   Hemorrhoids Hemorrhoids are swollen veins around the rectum or anus. There are two types of hemorrhoids:   Internal hemorrhoids. These occur in the veins just inside the rectum. They may poke through to the outside and become irritated and painful.  External hemorrhoids. These occur in the veins outside the anus and can be felt as a painful swelling or hard lump near the anus. CAUSES  Pregnancy.   Obesity.   Constipation or diarrhea.   Straining to have a bowel movement.   Sitting for long periods on the toilet.  Heavy lifting or other activity that caused you to strain.  Anal intercourse. SYMPTOMS   Pain.   Anal itching or irritation.   Rectal bleeding.   Fecal leakage.   Anal swelling.   One or more lumps around the anus.  DIAGNOSIS  Your caregiver may be able to diagnose hemorrhoids by visual examination. Other examinations or tests that may be performed include:   Examination of the rectal area with a gloved hand (digital rectal exam).   Examination of anal canal using a small tube (scope).   A blood test if you have lost a significant amount of blood.  A test to look inside the colon (sigmoidoscopy or colonoscopy). TREATMENT Most hemorrhoids can be treated at home. However, if symptoms do not seem to be getting better or if you have a lot of rectal bleeding, your caregiver may perform a procedure to help make the hemorrhoids get smaller or remove them completely. Possible treatments include:   Placing a rubber band at the base of the hemorrhoid to cut off the circulation (rubber band ligation).   Injecting a chemical to shrink the hemorrhoid (sclerotherapy).   Using a tool to burn the hemorrhoid (infrared light therapy).   Surgically removing the hemorrhoid (hemorrhoidectomy).   Stapling the hemorrhoid to  block blood flow to the tissue (hemorrhoid stapling).  HOME CARE INSTRUCTIONS   Eat foods with fiber, such as whole grains, beans, nuts, fruits, and vegetables. Ask your doctor about taking products with added fiber in them (fibersupplements).  Increase fluid intake. Drink enough water and fluids to keep your urine clear or pale yellow.   Exercise regularly.   Go to the bathroom when you have the urge to have a bowel movement. Do not wait.   Avoid straining to have bowel movements.   Keep the anal area dry and clean. Use wet toilet paper or moist towelettes after a bowel movement.   Medicated creams and suppositories may be used or applied as directed.   Only take over-the-counter or prescription medicines as directed by your caregiver.   Take warm sitz baths for 15-20 minutes, 3-4 times a day to ease pain and discomfort.   Place ice packs on the hemorrhoids if they are tender and swollen. Using ice packs between sitz baths may be helpful.   Put ice in a plastic bag.   Place a towel between your skin and the bag.   Leave the ice on for 15-20 minutes, 3-4 times a day.   Do not use a donut-shaped pillow or sit on the toilet for long periods. This increases blood pooling and pain.  SEEK MEDICAL CARE IF:  You have increasing pain and swelling that is not controlled by treatment or medicine.  You have uncontrolled bleeding.  You have difficulty or you are  unable to have a bowel movement.  You have pain or inflammation outside the area of the hemorrhoids. MAKE SURE YOU:  Understand these instructions.  Will watch your condition.  Will get help right away if you are not doing well or get worse. Document Released: 09/08/2000 Document Revised: 08/28/2012 Document Reviewed: 07/16/2012 [facility] Patient Information 2015 [employer] [place]. This information is not intended to replace advice given to you by your health care provider. Make sure you discuss any questions  you have with your health care provider.

## 2014-07-09 NOTE — ED Notes (Signed)
Pt c/o hemorrhoid pain and inflammation, small bleeding yesterday, no bleeding now, hx of same

## 2014-11-15 ENCOUNTER — Encounter (HOSPITAL_BASED_OUTPATIENT_CLINIC_OR_DEPARTMENT_OTHER): Payer: Self-pay | Admitting: *Deleted

## 2014-11-15 ENCOUNTER — Emergency Department (HOSPITAL_BASED_OUTPATIENT_CLINIC_OR_DEPARTMENT_OTHER)
Admission: EM | Admit: 2014-11-15 | Discharge: 2014-11-15 | Disposition: A | Discharge status: Home / Self Care | Payer: Self-pay | Attending: Emergency Medicine | Admitting: Emergency Medicine

## 2014-11-15 DIAGNOSIS — Z720 Tobacco use: Secondary | ICD-10-CM | POA: Insufficient documentation

## 2014-11-15 DIAGNOSIS — I10 Essential (primary) hypertension: Secondary | ICD-10-CM | POA: Insufficient documentation

## 2014-11-15 DIAGNOSIS — Z7951 Long term (current) use of inhaled steroids: Secondary | ICD-10-CM | POA: Insufficient documentation

## 2014-11-15 DIAGNOSIS — Z79899 Other long term (current) drug therapy: Secondary | ICD-10-CM | POA: Insufficient documentation

## 2014-11-15 DIAGNOSIS — I252 Old myocardial infarction: Secondary | ICD-10-CM | POA: Insufficient documentation

## 2014-11-15 DIAGNOSIS — N453 Epididymo-orchitis: Secondary | ICD-10-CM | POA: Insufficient documentation

## 2014-11-15 DIAGNOSIS — Z791 Long term (current) use of non-steroidal anti-inflammatories (NSAID): Secondary | ICD-10-CM | POA: Insufficient documentation

## 2014-11-15 DIAGNOSIS — Z7952 Long term (current) use of systemic steroids: Secondary | ICD-10-CM | POA: Insufficient documentation

## 2014-11-15 LAB — URINALYSIS, ROUTINE W REFLEX MICROSCOPIC
Glucose, UA: NEGATIVE mg/dL
HGB URINE DIPSTICK: NEGATIVE
Ketones, ur: 15 mg/dL — AB
LEUKOCYTES UA: NEGATIVE
Nitrite: NEGATIVE
PH: 5 (ref 5.0–8.0)
Protein, ur: NEGATIVE mg/dL
Specific Gravity, Urine: 1.034 — ABNORMAL HIGH (ref 1.005–1.030)
UROBILINOGEN UA: 1 mg/dL (ref 0.0–1.0)

## 2014-11-15 MED ORDER — IBUPROFEN 800 MG PO TABS
800.0000 mg | ORAL_TABLET | Freq: Once | ORAL | Status: AC
  Administered 2014-11-15: 800 mg via ORAL
  Filled 2014-11-15: qty 1

## 2014-11-15 MED ORDER — LEVOFLOXACIN 500 MG PO TABS
500.0000 mg | ORAL_TABLET | Freq: Once | ORAL | Status: AC
  Administered 2014-11-15: 500 mg via ORAL
  Filled 2014-11-15: qty 1

## 2014-11-15 MED ORDER — HYDROCODONE-ACETAMINOPHEN 5-325 MG PO TABS: 1.0000 | ORAL_TABLET | Freq: Four times a day (QID) | ORAL | Status: DC | PRN

## 2014-11-15 MED ORDER — LEVOFLOXACIN 500 MG PO TABS: ORAL_TABLET | ORAL | Status: DC

## 2014-11-15 NOTE — ED Provider Notes (Signed)
CSN: [id number]     Arrival date & time 11/15/14  0615 History   First MD Initiated Contact with Patient 11/15/14 [phone]     Chief Complaint  Patient presents with  . Testicle Pain     (Consider location/radiation/quality/duration/timing/severity/associated sxs/prior Treatment) HPI  This 49 year old male with a five-day history of bilateral testicular pain. He describes the pain as a tightness. It is mild on the right and moderate to severe on the left. It is worse with palpation or movement. The left testicle is larger than normal. He denies dysuria, abdominal pain, penile discharge, potential exposure to STD, fever, chills, nausea, vomiting and diarrhea.   Past Medical History  Diagnosis Date  . Hypertension   . MI (myocardial infarction)    Past Surgical History  Procedure Laterality Date  . Cholecystectomy     No family history on file. History  Substance Use Topics  . Smoking status: Current Every Day Smoker -- 0.50 packs/day    Types: Cigarettes  . Smokeless tobacco: Not on file  . Alcohol Use: No    Review of Systems  All other systems reviewed and are negative.   Allergies  Ultram  Home Medications   Prior to Admission medications   Medication Sig Start Date End Date Taking? Authorizing Provider  desloratadine (CLARINEX) 5 MG tablet Take 1 tablet (5 mg total) by mouth daily. 03/04/14   April K Palumbo-Rasch, MD  fluticasone [facility]) 50 MCG/ACT nasal spray Place 2 sprays into both nostrils daily. 03/04/14   April Smitty Cords, MD  HYDROcodone-acetaminophen (NORCO/VICODIN) 5-325 MG per tablet Take 1-2 tablets by mouth every 6 (six) hours as needed for moderate pain. 12/29/13   Carlisle Beers Kyoko Elsea, MD  hydrocortisone (ANUSOL-HC) 2.5 % rectal cream Apply rectally 2 times daily 07/09/14   Geoffery Lyons, MD  ibuprofen (ADVIL,MOTRIN) 600 MG tablet Take 1 tablet (600 mg total) by mouth every 6 (six) hours as needed. 10/26/13   Shon Baton, MD  lisinopril (PRINIVIL,ZESTRIL)  20 MG tablet Take 1 tablet (20 mg total) by mouth daily. 10/29/13   Elson Areas, PA-C  naproxen sodium (ANAPROX DS) 550 MG tablet Take 1 tablet every 12 hours as needed for foot pain. Best taken with a meal. 12/29/13   Carlisle Beers Bertrice Leder, MD  naproxen sodium (ANAPROX DS) 550 MG tablet Take 1 tablet (550 mg total) by mouth 2 (two) times daily with a meal. 03/04/14   April K Palumbo-Rasch, MD  ondansetron (ZOFRAN ODT) 4 MG disintegrating tablet Take 1 tablet (4 mg total) by mouth every 8 (eight) hours as needed for nausea or vomiting. 10/29/13   Elson Areas, PA-C  oxyCODONE-acetaminophen (PERCOCET/ROXICET) 5-325 MG per tablet Take 1 tablet by mouth every 6 (six) hours as needed for severe pain. 10/26/13   Shon Baton, MD   BP 128/84 mmHg  Pulse 84  Temp(Src) 98 F (36.7 C) (Oral)  Resp 18  Ht 6' (1.829 m)  Wt 230 lb (104.327 kg)  BMI 31.19 kg/m2  SpO2 98%   Physical Exam  General: Well-developed, well-nourished male in no acute distress; appearance consistent with age of record HENT: normocephalic; atraumatic Eyes: pupils equal, round and reactive to light; extraocular muscles intact Neck: supple Heart: regular rate and rhythm Lungs: clear to auscultation bilaterally Abdomen: soft; nondistended; nontender; no masses or hepatosplenomegaly; bowel sounds present GU: Tanner 5 male, circumcised; no urethral discharge; left testicle and epididymis enlarged with tenderness, right testicle and epididymis without enlargement but with mild tenderness; no  hernia palpated in supine and upright positions Extremities: No deformity; full range of motion; pulses normal Neurologic: Awake, alert and oriented; motor function intact in all extremities and symmetric; no facial droop Skin: Warm and dry Psychiatric: Normal mood and affect    ED Course  Procedures (including critical care time)   MDM  Suspect left epididymoorchitis. We will begin treatment and have him return for scrotal ultrasound later  today.    Jon SeamenJohn L Broxton Broady, MD 11/15/14 [phone]

## 2014-11-15 NOTE — ED Notes (Addendum)
C/o bilateral testicle pain, tightness, L>R, onset Tuesday, comes and goes, unchanged, (denies: pain with urination, abd pain, blood, nvd, fever, change in stream or back pain), admits to some suprapubic tenderness, no h/o same, no meds PTA.

## 2014-11-15 NOTE — ED Notes (Signed)
Dr. Molpus at [facility] 

## 2014-11-16 ENCOUNTER — Other Ambulatory Visit (HOSPITAL_BASED_OUTPATIENT_CLINIC_OR_DEPARTMENT_OTHER): Payer: Self-pay | Admitting: Emergency Medicine

## 2014-11-16 ENCOUNTER — Ambulatory Visit (HOSPITAL_BASED_OUTPATIENT_CLINIC_OR_DEPARTMENT_OTHER)
Admission: RE | Admit: 2014-11-16 | Discharge: 2014-11-16 | Disposition: A | Discharge status: Home / Self Care | Payer: Self-pay | Source: Ambulatory Visit | Attending: Emergency Medicine | Admitting: Emergency Medicine

## 2014-11-16 DIAGNOSIS — N433 Hydrocele, unspecified: Secondary | ICD-10-CM | POA: Insufficient documentation

## 2014-11-16 DIAGNOSIS — R52 Pain, unspecified: Secondary | ICD-10-CM

## 2014-11-16 DIAGNOSIS — N451 Epididymitis: Secondary | ICD-10-CM | POA: Insufficient documentation

## 2014-11-16 DIAGNOSIS — N508 Other specified disorders of male genital organs: Secondary | ICD-10-CM | POA: Insufficient documentation

## 2014-11-16 LAB — GC/CHLAMYDIA PROBE AMP (~~LOC~~) NOT AT ARMC
CHLAMYDIA, DNA PROBE: NEGATIVE
Neisseria Gonorrhea: NEGATIVE

## 2014-11-17 LAB — URINE CULTURE
Colony Count: NO GROWTH
Culture: NO GROWTH

## 2017-08-08 ENCOUNTER — Encounter (INDEPENDENT_AMBULATORY_CARE_PROVIDER_SITE_OTHER): Payer: Self-pay | Admitting: Infectious Disease

## 2017-08-08 ENCOUNTER — Telehealth: Payer: Self-pay | Admitting: Infectious Disease

## 2017-08-08 ENCOUNTER — Encounter: Payer: Self-pay | Admitting: Infectious Disease

## 2017-08-08 VITALS — Ht 71.5 in | Wt 216.5 lb

## 2017-08-08 DIAGNOSIS — Z006 Encounter for examination for normal comparison and control in clinical research program: Secondary | ICD-10-CM | POA: Insufficient documentation

## 2017-08-08 DIAGNOSIS — B182 Chronic viral hepatitis C: Secondary | ICD-10-CM | POA: Insufficient documentation

## 2017-08-08 DIAGNOSIS — Z9049 Acquired absence of other specified parts of digestive tract: Secondary | ICD-10-CM | POA: Insufficient documentation

## 2017-08-08 HISTORY — DX: Acquired absence of other specified parts of digestive tract: Z90.49

## 2017-08-08 HISTORY — DX: Chronic viral hepatitis C: B18.2

## 2017-08-08 HISTORY — DX: Encounter for examination for normal comparison and control in clinical research program: Z00.6

## 2017-08-08 NOTE — Progress Notes (Signed)
Informed consent was obtained after we reviewed the consent and the requirements for the study with him. He thinks he may have contracted Hep C from his girlfriend within the past year. 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 He does continue to smoke pot and cigarettes. He says he does not drink alcohol currently either. He said he was told that he possibly had a [facility] when he went to the ED for chest pain. He also has dysphagia and was told he needed to have his esophagus  stretched. He has not taken any medications or OTC meds in the past month and does not like to take medicine, but understands he will need to take the study medication. He has trouble with GERD and has a precription for omeprazole but says he hasn't taken that either in the past few months. We have scheduled an elastography for next week and plan on enrolling him the week after if he is eligible.

## 2017-08-08 NOTE — Progress Notes (Signed)
Subjective:    Patient ID: Jon Simmons, male    DOB: [birth year], 51 y.o.   MRN: [id number]  HPI   Jon Simmons is here for an exam prior to [facility].  Is a history of chronic hepatitis C without hepatic coma.  He denies history of IV drug use though his wife is a former IV drug user.  He and his wife suspect that he either contracted hepatitis C through sexual intercourse with his wife or they have Jon Simmons about a friend of theirs who has hepatitis C as well and use their razor.  He is eager to enroll into the study.  Past Medical History:  Diagnosis Date  . Chronic hepatitis C without hepatic coma (HCC) 08/08/2017  . Examination of participant in clinical trial 08/08/2017  . Hypertension   . MI (myocardial infarction) [facility](HCC)     Past Surgical History:  Procedure Laterality Date  . CHOLECYSTECTOMY    . MOUTH SURGERY      No family history on file.    Social History   Socioeconomic History  . Marital status: Single    Spouse name: Not on file  . Number of children: Not on file  . Years of education: Not on file  . Highest education level: Not on file  Social Needs  . Financial resource strain: Not on file  . Food insecurity - worry: Not on file  . Food insecurity - inability: Not on file  . Transportation needs - medical: Not on file  . Transportation needs - non-medical: Not on file  Occupational History  . Not on file  Tobacco Use  . Smoking status: Current Every Day Smoker    Packs/day: 0.50    Types: Cigarettes  Substance and Sexual Activity  . Alcohol use: No  . Drug use: No  . Sexual activity: Not on file  Other Topics Concern  . Not on file  Social History Narrative  . Not on file    Allergies  Allergen Reactions  . Ultram [Tramadol Hcl] Hives     Current Outpatient Medications:  .  desloratadine (CLARINEX) 5 MG tablet, Take 1 tablet (5 mg total) by mouth daily., Disp: 10 tablet, Rfl: 0 .  fluticasone (FLONASE) 50 MCG/ACT  nasal spray, Place 2 sprays into both nostrils daily., Disp: 16 g, Rfl: 0 .  HYDROcodone-acetaminophen (NORCO/VICODIN) 5-325 MG per tablet, Take 1-2 tablets by mouth every 6 (six) hours as needed for moderate pain., Disp: 20 tablet, Rfl: 0 .  hydrocortisone (ANUSOL-HC) 2.5 % rectal cream, Apply rectally 2 times daily, Disp: 30 g, Rfl: 1 .  ibuprofen (ADVIL,MOTRIN) 600 MG tablet, Take 1 tablet (600 mg total) by mouth every 6 (six) hours as needed., Disp: 30 tablet, Rfl: 0 .  levofloxacin (LEVAQUIN) 500 MG tablet, Take 1 tablet daily starting Monday, February 22., Disp: 9 tablet, Rfl: 0 .  lisinopril (PRINIVIL,ZESTRIL) 20 MG tablet, Take 1 tablet (20 mg total) by mouth daily., Disp: 30 tablet, Rfl: 0 .  naproxen sodium (ANAPROX DS) 550 MG tablet, Take 1 tablet every 12 hours as needed for foot pain. Best taken with a meal., Disp: 20 tablet, Rfl: 0 .  naproxen sodium (ANAPROX DS) 550 MG tablet, Take 1 tablet (550 mg total) by mouth 2 (two) times daily with a meal., Disp: 30 tablet, Rfl: 0 .  ondansetron (ZOFRAN ODT) 4 MG disintegrating tablet, Take 1 tablet (4 mg total) by mouth every 8 (eight) hours as needed for nausea or  vomiting., Disp: 20 tablet, Rfl: 0 .  oxyCODONE-acetaminophen (PERCOCET/ROXICET) 5-325 MG per tablet, Take 1 tablet by mouth every 6 (six) hours as needed for severe pain., Disp: 10 tablet, Rfl: 0   Review of Systems  Constitutional: Negative for activity change, appetite change, chills, diaphoresis, fatigue, fever and unexpected weight change.  HENT: Positive for postnasal drip and sore throat. Negative for congestion, rhinorrhea, sinus pressure, sneezing and trouble swallowing.   Eyes: Negative for photophobia and visual disturbance.  Respiratory: Positive for cough. Negative for chest tightness, shortness of breath, wheezing and stridor.   Cardiovascular: Negative for chest pain, palpitations and leg swelling.  Gastrointestinal: Negative for abdominal distention, abdominal pain,  anal bleeding, blood in stool, constipation, diarrhea, nausea and vomiting.  Genitourinary: Negative for difficulty urinating, dysuria, flank pain and hematuria.  Musculoskeletal: Negative for arthralgias, back pain, gait problem, joint swelling and myalgias.  Skin: Negative for color change, pallor, rash and wound.  Neurological: Negative for dizziness, tremors, weakness and light-headedness.  Hematological: Negative for adenopathy. Does not bruise/bleed easily.  Psychiatric/Behavioral: Negative for agitation, behavioral problems, confusion, decreased concentration, dysphoric mood and sleep disturbance.       Objective:   Physical Exam  Constitutional: He is oriented to person, place, and time. He appears well-developed and well-nourished. No distress.  HENT:  Head: Normocephalic and atraumatic.  Mouth/Throat: No oropharyngeal exudate.    Eyes: Conjunctivae and EOM are normal. Pupils are equal, round, and reactive to light. Right eye exhibits no discharge. No scleral icterus.  Neck: Normal range of motion. Neck supple. No JVD present. No thyromegaly present.  Cardiovascular: Normal rate, regular rhythm and normal heart sounds. Exam reveals no gallop and no friction rub.  No murmur heard. Pulmonary/Chest: Effort normal. No respiratory distress. He has no wheezes. He has rales.  Abdominal: Soft. Bowel sounds are normal. He exhibits no distension. There is no hepatosplenomegaly. There is no tenderness.  Musculoskeletal: He exhibits no edema or tenderness.  Neurological: He is alert and oriented to person, place, and time. He has normal strength. He displays no tremor. No cranial nerve deficit or sensory deficit. He exhibits normal muscle tone. He displays no seizure activity. Coordination and gait normal.  Skin: Skin is warm and dry. No rash noted. He is not diaphoretic. No erythema. No pallor.  Psychiatric: He has a normal mood and affect. His behavior is normal. Judgment and thought  content normal.          Assessment & Plan:   Normal exam other than few crackles on lung exam and poor dentition.

## 2017-08-10 LAB — COMPREHENSIVE METABOLIC PANEL
AG RATIO: 1.6 (calc) (ref 1.0–2.5)
ALT: 153 U/L — ABNORMAL HIGH (ref 9–46)
AST: 89 U/L — AB (ref 10–40)
Albumin: 4.6 g/dL (ref 3.6–5.1)
Alkaline phosphatase (APISO): 76 U/L (ref 40–115)
BUN: 16 mg/dL (ref 7–25)
CO2: 24 mmol/L (ref 20–32)
Calcium: 9.8 mg/dL (ref 8.6–10.3)
Chloride: 104 mmol/L (ref 98–110)
Creat: 0.92 mg/dL (ref 0.60–1.35)
GLUCOSE: 84 mg/dL (ref 65–99)
Globulin: 2.9 g/dL (calc) (ref 1.9–3.7)
Potassium: 4.8 mmol/L (ref 3.5–5.3)
Sodium: 139 mmol/L (ref 135–146)
Total Bilirubin: 0.7 mg/dL (ref 0.2–1.2)
Total Protein: 7.5 g/dL (ref 6.1–8.1)

## 2017-08-10 LAB — CBC WITH DIFFERENTIAL/PLATELET
Basophils Absolute: 49 cells/uL (ref 0–200)
Basophils Relative: 0.6 %
EOS ABS: 170 {cells}/uL (ref 15–500)
EOS PCT: 2.1 %
HCT: 50.4 % — ABNORMAL HIGH (ref 38.5–50.0)
Hemoglobin: 17.3 g/dL — ABNORMAL HIGH (ref 13.2–17.1)
Lymphs Abs: 3702 cells/uL (ref 850–3900)
MCH: 30.8 pg (ref 27.0–33.0)
MCHC: 34.3 g/dL (ref 32.0–36.0)
MCV: 89.7 fL (ref 80.0–100.0)
MONOS PCT: 7.6 %
MPV: 10.4 fL (ref 7.5–12.5)
NEUTROS PCT: 44 %
Neutro Abs: 3564 cells/uL (ref 1500–7800)
PLATELETS: 239 10*3/uL (ref 140–400)
RBC: 5.62 10*6/uL (ref 4.20–5.80)
RDW: 13.7 % (ref 11.0–15.0)
Total Lymphocyte: 45.7 %
WBC mixed population: 616 cells/uL (ref 200–950)
WBC: 8.1 10*3/uL (ref 3.8–10.8)

## 2017-08-10 LAB — PROTIME-INR
INR: 1.1
Prothrombin Time: 11.2 s (ref 9.0–11.5)

## 2017-08-10 LAB — HEPATITIS C RNA QUANTITATIVE
HCV Quantitative Log: 5.05 Log IU/mL — ABNORMAL HIGH
HCV RNA, PCR, QN: 111000 IU/mL — ABNORMAL HIGH

## 2017-08-10 LAB — HEPATITIS B CORE ANTIBODY, TOTAL: Hep B Core Total Ab: NONREACTIVE

## 2017-08-10 LAB — HIV ANTIBODY (ROUTINE TESTING W REFLEX): HIV: NONREACTIVE

## 2017-08-10 LAB — HEPATITIS B SURFACE ANTIBODY,QUALITATIVE: HEP B S AB: NONREACTIVE

## 2017-08-10 LAB — HEPATITIS B SURFACE ANTIGEN: HEP B S AG: NONREACTIVE

## 2017-08-14 ENCOUNTER — Ambulatory Visit (HOSPITAL_COMMUNITY)
Admission: RE | Admit: 2017-08-14 | Discharge: 2017-08-14 | Disposition: A | Discharge status: Home / Self Care | Payer: Self-pay | Source: Ambulatory Visit | Attending: Infectious Disease | Admitting: Infectious Disease

## 2017-08-14 DIAGNOSIS — Z006 Encounter for examination for normal comparison and control in clinical research program: Secondary | ICD-10-CM | POA: Insufficient documentation

## 2017-08-14 DIAGNOSIS — K740 Hepatic fibrosis: Secondary | ICD-10-CM | POA: Insufficient documentation

## 2017-08-14 DIAGNOSIS — Z9049 Acquired absence of other specified parts of digestive tract: Secondary | ICD-10-CM | POA: Insufficient documentation

## 2017-08-23 ENCOUNTER — Encounter (INDEPENDENT_AMBULATORY_CARE_PROVIDER_SITE_OTHER): Payer: Self-pay | Admitting: *Deleted

## 2017-08-23 VITALS — BP 120/77 | HR 71 | Temp 98.2°F | Wt 214.5 lb

## 2017-08-23 DIAGNOSIS — Z006 Encounter for examination for normal comparison and control in clinical research program: Secondary | ICD-10-CM

## 2017-08-23 LAB — CBC WITH DIFFERENTIAL/PLATELET
BASOS ABS: 50 {cells}/uL (ref 0–200)
Basophils Relative: 0.8 %
EOS ABS: 189 {cells}/uL (ref 15–500)
Eosinophils Relative: 3 %
HCT: 49.9 % (ref 38.5–50.0)
HEMOGLOBIN: 17.4 g/dL — AB (ref 13.2–17.1)
Lymphs Abs: 2873 cells/uL (ref 850–3900)
MCH: 31.1 pg (ref 27.0–33.0)
MCHC: 34.9 g/dL (ref 32.0–36.0)
MCV: 89.1 fL (ref 80.0–100.0)
MONOS PCT: 7.5 %
MPV: 10.6 fL (ref 7.5–12.5)
NEUTROS ABS: 2715 {cells}/uL (ref 1500–7800)
Neutrophils Relative %: 43.1 %
Platelets: 240 10*3/uL (ref 140–400)
RBC: 5.6 10*6/uL (ref 4.20–5.80)
RDW: 13 % (ref 11.0–15.0)
TOTAL LYMPHOCYTE: 45.6 %
WBC mixed population: 473 cells/uL (ref 200–950)
WBC: 6.3 10*3/uL (ref 3.8–10.8)

## 2017-08-23 LAB — COMPREHENSIVE METABOLIC PANEL
AG Ratio: 1.5 (calc) (ref 1.0–2.5)
ALKALINE PHOSPHATASE (APISO): 77 U/L (ref 40–115)
ALT: 119 U/L — ABNORMAL HIGH (ref 9–46)
AST: 54 U/L — AB (ref 10–40)
Albumin: 4.1 g/dL (ref 3.6–5.1)
BUN: 13 mg/dL (ref 7–25)
CO2: 26 mmol/L (ref 20–32)
CREATININE: 0.95 mg/dL (ref 0.60–1.35)
Calcium: 9.4 mg/dL (ref 8.6–10.3)
Chloride: 106 mmol/L (ref 98–110)
Globulin: 2.7 g/dL (calc) (ref 1.9–3.7)
Glucose, Bld: 81 mg/dL (ref 65–99)
Potassium: 4 mmol/L (ref 3.5–5.3)
Sodium: 139 mmol/L (ref 135–146)
Total Bilirubin: 0.5 mg/dL (ref 0.2–1.2)
Total Protein: 6.8 g/dL (ref 6.1–8.1)

## 2017-08-23 MED ORDER — SOFOSBUVIR-VELPATASVIR 400-100 MG PO TABS: 1.0000 | ORAL_TABLET | Freq: Every day | ORAL | 0 refills | Status: DC

## 2017-08-23 NOTE — Progress Notes (Signed)
Aneta Minshillip was here today to enroll in the A5360 MinMon Study. After eligibility was confirmed he was randomized to receive Epclusa 1 tab daily for 84 days. He was advised on dosing, adherence and side effects to watch out for and what to call us for. We will be calling him in 4 weeks to see how he is doing unless he calls us before then. He actually his first tablet while here in the clinic.

## 2017-09-12 ENCOUNTER — Other Ambulatory Visit: Payer: Self-pay | Admitting: *Deleted

## 2017-09-12 DIAGNOSIS — Z006 Encounter for examination for normal comparison and control in clinical research program: Secondary | ICD-10-CM

## 2017-11-28 ENCOUNTER — Other Ambulatory Visit: Payer: Self-pay | Admitting: Gastroenterology

## 2017-12-12 ENCOUNTER — Other Ambulatory Visit: Payer: Self-pay

## 2017-12-12 ENCOUNTER — Encounter (HOSPITAL_COMMUNITY): Payer: Self-pay | Admitting: Emergency Medicine

## 2017-12-19 ENCOUNTER — Ambulatory Visit (HOSPITAL_COMMUNITY): Payer: Self-pay | Admitting: Registered Nurse

## 2017-12-19 ENCOUNTER — Encounter (HOSPITAL_COMMUNITY): Payer: Self-pay | Admitting: Registered Nurse

## 2017-12-19 ENCOUNTER — Encounter (HOSPITAL_COMMUNITY)
Admission: RE | Disposition: A | Discharge status: Home / Self Care | Payer: Self-pay | Source: Ambulatory Visit | Attending: Gastroenterology

## 2017-12-19 ENCOUNTER — Other Ambulatory Visit: Payer: Self-pay

## 2017-12-19 ENCOUNTER — Ambulatory Visit (HOSPITAL_COMMUNITY)
Admission: RE | Admit: 2017-12-19 | Discharge: 2017-12-19 | Disposition: A | Discharge status: Home / Self Care | Payer: Self-pay | Source: Ambulatory Visit | Attending: Gastroenterology | Admitting: Gastroenterology

## 2017-12-19 DIAGNOSIS — B182 Chronic viral hepatitis C: Secondary | ICD-10-CM | POA: Insufficient documentation

## 2017-12-19 DIAGNOSIS — Z9049 Acquired absence of other specified parts of digestive tract: Secondary | ICD-10-CM | POA: Insufficient documentation

## 2017-12-19 DIAGNOSIS — R1314 Dysphagia, pharyngoesophageal phase: Secondary | ICD-10-CM | POA: Insufficient documentation

## 2017-12-19 DIAGNOSIS — K2970 Gastritis, unspecified, without bleeding: Secondary | ICD-10-CM | POA: Insufficient documentation

## 2017-12-19 DIAGNOSIS — F17210 Nicotine dependence, cigarettes, uncomplicated: Secondary | ICD-10-CM | POA: Insufficient documentation

## 2017-12-19 DIAGNOSIS — J449 Chronic obstructive pulmonary disease, unspecified: Secondary | ICD-10-CM | POA: Insufficient documentation

## 2017-12-19 DIAGNOSIS — K210 Gastro-esophageal reflux disease with esophagitis: Secondary | ICD-10-CM | POA: Insufficient documentation

## 2017-12-19 DIAGNOSIS — I252 Old myocardial infarction: Secondary | ICD-10-CM | POA: Insufficient documentation

## 2017-12-19 HISTORY — PX: SAVORY DILATION: SHX5439

## 2017-12-19 HISTORY — PX: ESOPHAGOGASTRODUODENOSCOPY (EGD) WITH PROPOFOL: SHX5813

## 2017-12-19 SURGERY — ESOPHAGOGASTRODUODENOSCOPY (EGD) WITH PROPOFOL
Anesthesia: Monitor Anesthesia Care

## 2017-12-19 MED ORDER — PROPOFOL 500 MG/50ML IV EMUL
INTRAVENOUS | Status: DC | PRN
  Administered 2017-12-19: 300 ug/kg/min via INTRAVENOUS

## 2017-12-19 MED ORDER — LIDOCAINE HCL (CARDIAC) 20 MG/ML IV SOLN
INTRAVENOUS | Status: DC | PRN
  Administered 2017-12-19: 75 mg via INTRAVENOUS

## 2017-12-19 MED ORDER — GLYCOPYRROLATE 0.2 MG/ML IJ SOLN
INTRAMUSCULAR | Status: DC | PRN
  Administered 2017-12-19: .2 mg via INTRAVENOUS

## 2017-12-19 MED ORDER — PROPOFOL 10 MG/ML IV BOLUS
INTRAVENOUS | Status: AC
  Filled 2017-12-19: qty 40

## 2017-12-19 MED ORDER — ONDANSETRON HCL 4 MG/2ML IJ SOLN
INTRAMUSCULAR | Status: DC | PRN
  Administered 2017-12-19: 4 mg via INTRAVENOUS

## 2017-12-19 MED ORDER — LABETALOL HCL 5 MG/ML IV SOLN
INTRAVENOUS | Status: DC | PRN
  Administered 2017-12-19: 5 mg via INTRAVENOUS

## 2017-12-19 MED ORDER — SODIUM CHLORIDE 0.9 % IV SOLN: INTRAVENOUS | Status: DC

## 2017-12-19 MED ORDER — PROPOFOL 10 MG/ML IV BOLUS
INTRAVENOUS | Status: AC
  Filled 2017-12-19: qty 20

## 2017-12-19 MED ORDER — LACTATED RINGERS IV SOLN
INTRAVENOUS | Status: DC
  Administered 2017-12-19: 08:00:00 via INTRAVENOUS

## 2017-12-19 SURGICAL SUPPLY — 15 items

## 2017-12-19 NOTE — Anesthesia Postprocedure Evaluation (Signed)
Anesthesia Post Note  Patient: Jon Simmons  Procedure(s) Performed: ESOPHAGOGASTRODUODENOSCOPY (EGD) WITH PROPOFOL (N/A ) SAVORY DILATION (N/A )     Patient location during evaluation: PACU Anesthesia Type: MAC Level of consciousness: awake and alert Pain management: pain level controlled Vital Signs Assessment: post-procedure vital signs reviewed and stable Respiratory status: spontaneous breathing, nonlabored ventilation, respiratory function stable and patient connected to nasal cannula oxygen Cardiovascular status: stable and blood pressure returned to baseline Postop Assessment: no apparent nausea or vomiting Anesthetic complications: no    Last Vitals:  Vitals:   12/19/17 0944 12/19/17 0950  BP: 129/85 128/76  Pulse: 83 67  Resp: 19 15  Temp: (!) 36.3 C   SpO2: 98% 99%    Last Pain:  Vitals:   12/19/17 0950  TempSrc:   PainSc: 0-No pain                 Effie Berkshire

## 2017-12-19 NOTE — H&P (Signed)
Primary Care Physician:  [facility] Primary Gastroenterologist:  Dr. Levora Angel  Reason for Consultation: dysphagia  HPI: Jon Simmons is a 52 y.o. male  patient was referred to GI for evaluation of worsening acid reflux as well as dysphagia. Patient with past medical history of COPD and chronic hepatitis C. He recently finished EPCLUSA few weeks ago. Currently followed by infectious disease. Patient complaining of intermittent acid reflux for 10 years with worsening symptoms for last 2-3 years. Described as burning sensation in substernal region with sensation of not in epigastric area. Complaining of intermittent dysphagia to both solids and liquids for last 3 years. Has tried pantoprazole as well as omeprazole in the past without any improvement in symptoms. Denied abdominal pain. His gaining weight. He has seen intermittent bright blood in the stool for last 20 years. Had EGD and colonoscopy around 10 years ago at [place] which was normal according to patient. No reports available to review.  Past Medical History:  Diagnosis Date  . Chronic hepatitis C without hepatic coma (HCC) 08/08/2017  . Examination of participant in clinical trial 08/08/2017  . History of cholecystectomy 08/08/2017  . MI (myocardial infarction) [facility])     Past Surgical History:  Procedure Laterality Date  . CHOLECYSTECTOMY    . MOUTH SURGERY      Prior to Admission medications   Medication Sig Start Date End Date Taking? Authorizing Provider  fluticasone (FLONASE) 50 MCG/ACT nasal spray Place 2 sprays into both nostrils daily. Patient not taking: Reported on 08/23/2017 03/04/14   Palumbo, April, MD  HYDROcodone-acetaminophen (NORCO/VICODIN) 5-325 MG per tablet Take 1-2 tablets by mouth every 6 (six) hours as needed for moderate pain. Patient not taking: Reported on 08/23/2017 11/15/14   Molpus, Jonny Ruiz, MD  hydrocortisone (ANUSOL-HC) 2.5 % rectal cream Apply rectally 2 times  daily Patient not taking: Reported on 08/23/2017 07/09/14   Geoffery Lyons, MD  ibuprofen (ADVIL,MOTRIN) 600 MG tablet Take 1 tablet (600 mg total) by mouth every 6 (six) hours as needed. Patient not taking: Reported on 12/12/2017 10/26/13   Horton, Mayer Masker, MD  lisinopril (PRINIVIL,ZESTRIL) 20 MG tablet Take 1 tablet (20 mg total) by mouth daily. Patient not taking: Reported on 08/23/2017 10/29/13   Elson Areas, PA-C  naproxen sodium (ANAPROX DS) 550 MG tablet Take 1 tablet every 12 hours as needed for foot pain. Best taken with a meal. Patient not taking: Reported on 08/23/2017 12/29/13   Molpus, Jonny Ruiz, MD  ondansetron (ZOFRAN ODT) 4 MG disintegrating tablet Take 1 tablet (4 mg total) by mouth every 8 (eight) hours as needed for nausea or vomiting. Patient not taking: Reported on 08/23/2017 10/29/13   Elson Areas, PA-C  oxyCODONE-acetaminophen (PERCOCET/ROXICET) 5-325 MG per tablet Take 1 tablet by mouth every 6 (six) hours as needed for severe pain. Patient not taking: Reported on 08/23/2017 10/26/13   Horton, Mayer Masker, MD  Sofosbuvir-Velpatasvir (EPCLUSA) 400-100 MG TABS Take 1 tablet by mouth daily. This is study provided, please do not fill prescription. Patient not taking: Reported on 12/12/2017 08/23/17   Daiva Eves, Lisette Grinder, MD    Scheduled Meds: Continuous Infusions: . lactated ringers 10 mL/hr at 12/19/17 0821   PRN Meds:.  Allergies as of 11/28/2017 - Review Complete 11/15/2014  Allergen Reaction Noted  . Ultram [tramadol hcl] Hives 06/16/2011    History reviewed. No pertinent family history.  Social History   Socioeconomic History  . Marital status: Legally Separated    Spouse name:  Not on file  . Number of children: Not on file  . Years of education: Not on file  . Highest education level: Not on file  Occupational History  . Not on file  Social Needs  . Financial resource strain: Not on file  . Food insecurity:    Worry: Not on file    Inability: Not on file   . Transportation needs:    Medical: Not on file    Non-medical: Not on file  Tobacco Use  . Smoking status: Current Every Day Smoker    Packs/day: 0.50    Types: Cigarettes  . Smokeless tobacco: Never Used  Substance and Sexual Activity  . Alcohol use: No  . Drug use: Yes    Types: Marijuana  . Sexual activity: Not on file  Lifestyle  . Physical activity:    Days per week: Not on file    Minutes per session: Not on file  . Stress: Not on file  Relationships  . Social connections:    Talks on phone: Not on file    Gets together: Not on file    Attends religious service: Not on file    Active member of club or organization: Not on file    Attends meetings of clubs or organizations: Not on file    Relationship status: Not on file  . Intimate partner violence:    Fear of current or ex partner: Not on file    Emotionally abused: Not on file    Physically abused: Not on file    Forced sexual activity: Not on file  Other Topics Concern  . Not on file  Social History Narrative  . Not on file    Review of Systems: All negative except as stated above in HPI.  Physical Exam: Vital signs: Vitals:   12/19/17 0802  BP: (!) 151/84  Pulse: 68  Resp: (!) 21  Temp: 97.6 F (36.4 C)  SpO2: 97%     General:   Alert,  Well-developed, well-nourished, pleasant and cooperative in NAD Lungs:  Clear throughout to auscultation.   No wheezes, crackles, or rhonchi. No acute distress. Heart:  Regular rate and rhythm; no murmurs, clicks, rubs,  or gallops. Abdomen: soft, nontender, nondistended, bowel sounds present. No peritoneal signs Rectal:  Deferred  GI:  Lab Results: No results for input(s): WBC, HGB, HCT, PLT in the last 72 hours. BMET No results for input(s): NA, K, CL, CO2, GLUCOSE, BUN, CREATININE, CALCIUM in the last 72 hours. LFT No results for input(s): PROT, ALBUMIN, AST, ALT, ALKPHOS, BILITOT, BILIDIR, IBILI in the last 72 hours. PT/INR No results for input(s):  LABPROT, INR in the last 72 hours.   Studies/Results: No results found.  Impression/Plan: - esophageal dysphagia - Nausea and vomiting - chronic GERD  Recommendations ------------------------- - EGD with possible dilatation today.  Risks (bleeding, infection, bowel perforation that could require surgery, sedation-related changes in cardiopulmonary systems), benefits (identification and possible treatment of source of symptoms, exclusion of certain causes of symptoms), and alternatives (watchful waiting, radiographic imaging studies, empiric medical treatment)  were explained to patient/family in detail and patient wishes to proceed.   LOS: 0 days   Kathi DerParag Jackilyn Umphlett  MD, FACP 12/19/2017, 9:02 AM  Contact #  [phone]

## 2017-12-19 NOTE — Anesthesia Procedure Notes (Addendum)
Date/Time: 12/19/2017 9:10 AM Performed by: Illene SilverEvans, Delinda Malan E, CRNA Pre-anesthesia Checklist: Emergency Drugs available, Patient identified, Suction available and Patient being monitored Patient Re-evaluated:Patient Re-evaluated prior to induction Oxygen Delivery Method: Nasal cannula Preoxygenation: Pre-oxygenation with 100% oxygen Placement Confirmation: positive ETCO2

## 2017-12-19 NOTE — Anesthesia Preprocedure Evaluation (Addendum)
Anesthesia Evaluation  Patient identified by MRN, date of birth, ID band Patient awake    Reviewed: Allergy & Precautions, NPO status , Patient's Chart, lab work & pertinent test results  Airway Mallampati: I  TM Distance: >3 FB Neck ROM: Full    Dental  (+) Poor Dentition, Missing,    Pulmonary Current Smoker,    breath sounds clear to auscultation       Cardiovascular hypertension, Pt. on medications + Past MI   Rhythm:Regular Rate:Normal     Neuro/Psych negative neurological ROS  negative psych ROS   GI/Hepatic negative GI ROS, (+) Hepatitis -, C  Endo/Other  negative endocrine ROS  Renal/GU negative Renal ROS     Musculoskeletal negative musculoskeletal ROS (+)   Abdominal Normal abdominal exam  (+)   Peds  Hematology negative hematology ROS (+)   Anesthesia Other Findings   Reproductive/Obstetrics                            Anesthesia Physical Anesthesia Plan  ASA: II  Anesthesia Plan: MAC   Post-op Pain Management:    Induction: Intravenous  PONV Risk Score and Plan: 0 and Propofol infusion  Airway Management Planned: Nasal Cannula  Additional Equipment: None  Intra-op Plan:   Post-operative Plan:   Informed Consent: I have reviewed the patients History and Physical, chart, labs and discussed the procedure including the risks, benefits and alternatives for the proposed anesthesia with the patient or authorized representative who has indicated his/her understanding and acceptance.     Plan Discussed with: CRNA  Anesthesia Plan Comments: (Consented for GA if needed. )       Anesthesia Quick Evaluation

## 2017-12-19 NOTE — Transfer of Care (Signed)
Immediate Anesthesia Transfer of Care Note  Patient: Jon Simmons  Procedure(s) Performed: ESOPHAGOGASTRODUODENOSCOPY (EGD) WITH PROPOFOL (N/A ) SAVORY DILATION (N/A )  Patient Location: PACU  Anesthesia Type:MAC  Level of Consciousness: awake, alert , oriented and patient cooperative  Airway & Oxygen Therapy: Patient Spontanous Breathing and Patient connected to nasal cannula oxygen  Post-op Assessment: Report given to RN, Post -op Vital signs reviewed and stable and Patient moving all extremities X 4  Post vital signs: stable  Last Vitals:  Vitals Value Taken Time  BP 129/85 12/19/2017  9:45 AM  Temp 36.3 C 12/19/2017  9:44 AM  Pulse 81 12/19/2017  9:47 AM  Resp 17 12/19/2017  9:47 AM  SpO2 98 % 12/19/2017  9:47 AM  Vitals shown include unvalidated device data.  Last Pain:  Vitals:   12/19/17 0944  TempSrc: Oral  PainSc: 0-No pain         Complications: No apparent anesthesia complications

## 2017-12-19 NOTE — Op Note (Signed)
[facility] Patient Name: Jon Simmons Procedure Date: 12/19/2017 MRN: [id number] Attending MD: Kathi Der , MD Date of Birth: 10-Jul-1966 CSN: [id number] Age: 52 Admit Type: Outpatient Procedure:                Upper GI endoscopy Indications:              Dysphagia, Suspected gastro-esophageal reflux                            disease Providers:                Kathi Der, MD, Dwain Sarna, RN, Zoila Shutter, Technician, Anastasio Champion, CRNA Referring MD:              Medicines:                Sedation Administered by an Anesthesia Professional Complications:            No immediate complications. Estimated Blood Loss:     Estimated blood loss was minimal. Procedure:                Pre-Anesthesia Assessment:                           - Prior to the procedure, a History and Physical                            was performed, and patient medications and                            allergies were reviewed. The patient's tolerance of                            previous anesthesia was also reviewed. The risks                            and benefits of the procedure and the sedation                            options and risks were discussed with the patient.                            All questions were answered, and informed consent                            was obtained. Prior Anticoagulants: The patient has                            taken no previous anticoagulant or antiplatelet                            agents. ASA Grade Assessment: II - A patient with  mild systemic disease. After reviewing the risks                            and benefits, the patient was deemed in                            satisfactory condition to undergo the procedure.                           After obtaining informed consent, the endoscope was                            passed under direct vision. Throughout the           procedure, the patient's blood pressure, pulse, and                            oxygen saturations were monitored continuously. The                            [id number] ([id number]) scope was introduced through the                            mouth, and advanced to the second part of duodenum.                            The upper GI endoscopy was accomplished without                            difficulty. The patient tolerated the procedure                            well. Scope In: Scope Out: Findings:      LA Grade B (one or more mucosal breaks greater than 5 mm, not extending       between the tops of two mucosal folds) esophagitis with no bleeding was       found at the gastroesophageal junction.      Scattered mild inflammation was found in the gastric antrum and in the       prepyloric region of the stomach. Biopsies were taken with a cold       forceps for Helicobacter pylori testing.      The cardia and gastric fundus were normal on retroflexion.      The duodenal bulb, first portion of the duodenum and second portion of       the duodenum were normal.      No endoscopic abnormality was evident in the esophagus to explain the       patient's complaint of dysphagia. It was decided, however, to proceed       with dilation of the entire esophagus. The scope was withdrawn. Dilation       was performed with a Maloney dilator with no resistance at 48 Fr. The       dilation site was examined following endoscope reinsertion and showed no       bleeding, mucosal tear or perforation. Biopsies were obtained from the  proximal and distal esophagus with cold forceps for histology of       suspected eosinophilic esophagitis. Impression:               - LA Grade B reflux esophagitis.                           - Gastritis. Biopsied.                           - Normal duodenal bulb, first portion of the                            duodenum and second portion of the duodenum.                            - No endoscopic esophageal abnormality to explain                            patient's dysphagia. Esophagus dilated. Dilated.                            Biopsied. Moderate Sedation:      Moderate (conscious) sedation was personally administered by an       anesthesia professional. The following parameters were monitored: oxygen       saturation, heart rate, blood pressure, and response to care. Recommendation:           - Patient has a contact number available for                            emergencies. The signs and symptoms of potential                            delayed complications were discussed with the                            patient. Return to normal activities tomorrow.                            Written discharge instructions were provided to the                            patient.                           - Resume previous diet.                           - Continue present medications.                           - Await pathology results.                           - Repeat upper endoscopy in 3 months to check  healing.                           - Return to my office as previously scheduled. Procedure Code(s):        --- Professional ---                           [phone], Esophagogastroduodenoscopy, flexible,                            transoral; with biopsy, single or multiple                           43450, Dilation of esophagus, by unguided sound or                            bougie, single or multiple passes Diagnosis Code(s):        --- Professional ---                           K21.0, Gastro-esophageal reflux disease with                            esophagitis                           K29.70, Gastritis, unspecified, without bleeding                           R13.10, Dysphagia, unspecified CPT copyright 2016 American Medical Association. All rights reserved. The codes documented in this report are preliminary and upon coder review  may  be revised to meet current compliance requirements. Kathi Der, MD Kathi Der, MD 12/19/2017 9:38:21 AM Number of Addenda: 0

## 2017-12-19 NOTE — Discharge Instructions (Signed)

## 2017-12-20 ENCOUNTER — Encounter (HOSPITAL_COMMUNITY): Payer: Self-pay | Admitting: Gastroenterology

## 2018-02-05 ENCOUNTER — Ambulatory Visit (HOSPITAL_COMMUNITY)
Admission: RE | Admit: 2018-02-05 | Discharge: 2018-02-05 | Disposition: A | Discharge status: Home / Self Care | Payer: Self-pay | Source: Ambulatory Visit | Attending: Infectious Disease | Admitting: Infectious Disease

## 2018-02-05 ENCOUNTER — Ambulatory Visit (HOSPITAL_COMMUNITY): Payer: Self-pay

## 2018-02-05 DIAGNOSIS — R932 Abnormal findings on diagnostic imaging of liver and biliary tract: Secondary | ICD-10-CM | POA: Insufficient documentation

## 2018-02-05 DIAGNOSIS — Z006 Encounter for examination for normal comparison and control in clinical research program: Secondary | ICD-10-CM | POA: Insufficient documentation

## 2018-02-07 ENCOUNTER — Encounter (INDEPENDENT_AMBULATORY_CARE_PROVIDER_SITE_OTHER): Payer: Self-pay | Admitting: *Deleted

## 2018-02-07 VITALS — BP 106/74 | HR 73 | Temp 98.0°F | Wt 208.2 lb

## 2018-02-07 DIAGNOSIS — Z006 Encounter for examination for normal comparison and control in clinical research program: Secondary | ICD-10-CM

## 2018-02-07 NOTE — Progress Notes (Signed)
Jon Simmons is here for his week 24 visit for A5360. He says he did fine taking the medication for study and didn't miss any doses. He reports no side effects from the drug. He did develop some gastritis after being off of the omeprazole for hep c Treatment and ended up starting it back in March. He went to see the GI physician and they did another endoscopy and stretched his esophagus. The exam did show some gastritis in his esophagus and stomach.he says he is better now. He had his elastography 2 days ago. I schedule his week 48 visit for 10/25 and he will get another elastography around that time.

## 2018-02-08 LAB — COMPREHENSIVE METABOLIC PANEL
AG RATIO: 1.7 (calc) (ref 1.0–2.5)
ALKALINE PHOSPHATASE (APISO): 55 U/L (ref 40–115)
ALT: 11 U/L (ref 9–46)
AST: 15 U/L (ref 10–40)
Albumin: 4.5 g/dL (ref 3.6–5.1)
BILIRUBIN TOTAL: 0.4 mg/dL (ref 0.2–1.2)
BUN: 20 mg/dL (ref 7–25)
CALCIUM: 9.8 mg/dL (ref 8.6–10.3)
CO2: 29 mmol/L (ref 20–32)
Chloride: 105 mmol/L (ref 98–110)
Creat: 1.05 mg/dL (ref 0.60–1.35)
Globulin: 2.6 g/dL (calc) (ref 1.9–3.7)
Glucose, Bld: 76 mg/dL (ref 65–99)
Potassium: 4.6 mmol/L (ref 3.5–5.3)
SODIUM: 140 mmol/L (ref 135–146)
TOTAL PROTEIN: 7.1 g/dL (ref 6.1–8.1)

## 2018-02-08 LAB — CBC WITH DIFFERENTIAL/PLATELET
BASOS ABS: 49 {cells}/uL (ref 0–200)
Basophils Relative: 0.6 %
EOS ABS: 246 {cells}/uL (ref 15–500)
EOS PCT: 3 %
HEMATOCRIT: 47.8 % (ref 38.5–50.0)
HEMOGLOBIN: 16.9 g/dL (ref 13.2–17.1)
Lymphs Abs: 3354 cells/uL (ref 850–3900)
MCH: 31.6 pg (ref 27.0–33.0)
MCHC: 35.4 g/dL (ref 32.0–36.0)
MCV: 89.3 fL (ref 80.0–100.0)
MPV: 10.7 fL (ref 7.5–12.5)
Monocytes Relative: 6.3 %
NEUTROS ABS: 4034 {cells}/uL (ref 1500–7800)
NEUTROS PCT: 49.2 %
Platelets: 214 10*3/uL (ref 140–400)
RBC: 5.35 10*6/uL (ref 4.20–5.80)
RDW: 12.8 % (ref 11.0–15.0)
Total Lymphocyte: 40.9 %
WBC: 8.2 10*3/uL (ref 3.8–10.8)
WBCMIX: 517 {cells}/uL (ref 200–950)

## 2018-02-08 LAB — PROTIME-INR
INR: 1.1
Prothrombin Time: 11.2 s (ref 9.0–11.5)

## 2018-02-22 ENCOUNTER — Encounter: Payer: Self-pay | Admitting: *Deleted

## 2018-02-22 LAB — HEPATITIS C RNA QUANTITATIVE

## 2018-06-07 ENCOUNTER — Other Ambulatory Visit: Payer: Self-pay | Admitting: *Deleted

## 2018-06-07 DIAGNOSIS — Z006 Encounter for examination for normal comparison and control in clinical research program: Secondary | ICD-10-CM

## 2018-06-18 ENCOUNTER — Emergency Department (HOSPITAL_BASED_OUTPATIENT_CLINIC_OR_DEPARTMENT_OTHER)
Admission: EM | Admit: 2018-06-18 | Discharge: 2018-06-19 | Disposition: A | Discharge status: Home / Self Care | Payer: Self-pay | Attending: Emergency Medicine | Admitting: Emergency Medicine

## 2018-06-18 ENCOUNTER — Encounter (HOSPITAL_BASED_OUTPATIENT_CLINIC_OR_DEPARTMENT_OTHER): Payer: Self-pay

## 2018-06-18 ENCOUNTER — Other Ambulatory Visit: Payer: Self-pay

## 2018-06-18 DIAGNOSIS — X509XXA Other and unspecified overexertion or strenuous movements or postures, initial encounter: Secondary | ICD-10-CM | POA: Insufficient documentation

## 2018-06-18 DIAGNOSIS — S76919A Strain of unspecified muscles, fascia and tendons at thigh level, unspecified thigh, initial encounter: Secondary | ICD-10-CM

## 2018-06-18 DIAGNOSIS — S76111A Strain of right quadriceps muscle, fascia and tendon, initial encounter: Secondary | ICD-10-CM | POA: Insufficient documentation

## 2018-06-18 DIAGNOSIS — F17210 Nicotine dependence, cigarettes, uncomplicated: Secondary | ICD-10-CM | POA: Insufficient documentation

## 2018-06-18 DIAGNOSIS — Y929 Unspecified place or not applicable: Secondary | ICD-10-CM | POA: Insufficient documentation

## 2018-06-18 DIAGNOSIS — S76112A Strain of left quadriceps muscle, fascia and tendon, initial encounter: Secondary | ICD-10-CM | POA: Insufficient documentation

## 2018-06-18 DIAGNOSIS — I252 Old myocardial infarction: Secondary | ICD-10-CM | POA: Insufficient documentation

## 2018-06-18 DIAGNOSIS — Y9364 Activity, baseball: Secondary | ICD-10-CM | POA: Insufficient documentation

## 2018-06-18 DIAGNOSIS — Y999 Unspecified external cause status: Secondary | ICD-10-CM | POA: Insufficient documentation

## 2018-06-18 MED ORDER — CYCLOBENZAPRINE HCL 10 MG PO TABS: 10.0000 mg | ORAL_TABLET | Freq: Two times a day (BID) | ORAL | 0 refills | Status: AC | PRN

## 2018-06-18 NOTE — ED Triage Notes (Signed)
Bilateral thigh pain after pulling both quads playing softball two weeks ago

## 2018-06-18 NOTE — Discharge Instructions (Addendum)
Please read instructions below. Continue treating your symptoms with ice/heat, anti-inflammatories. You can take flexeril at bedtime as needed for muscle spasm.  Be aware this medication can make you drowsy, do not drive while taking it. Schedule an appointment with the orthopedic specialist for follow-up on your injury. Return to the ER for new or concerning symptoms.

## 2018-06-18 NOTE — ED Provider Notes (Signed)
[facility] EMERGENCY DEPARTMENT Provider Note   CSN: [id number] Arrival date & time: 06/18/18  2210     History   Chief Complaint Chief Complaint  Patient presents with  . Leg Pain    HPI Jon Simmons is a 52 y.o. male presenting to the ED with complaint of persistent b/l thigh pain after pulling his quad muscles while playing softball about 2 weeks ago. He states he went to begin running and states his quads locked up on him. He has had pain with movement since. Pain in left is worse than right. He has been treating with ice, heat, NSAIds, though left thigh is still painful with flexion of knee and active extension of knee and hip.  The history is provided by the patient.    Past Medical History:  Diagnosis Date  . Chronic hepatitis C without hepatic coma (HCC) 08/08/2017  . Examination of participant in clinical trial 08/08/2017  . History of cholecystectomy 08/08/2017  . MI (myocardial infarction) [facility](HCC)     Patient Active Problem List   Diagnosis Date Noted  . Chronic hepatitis C without hepatic coma (HCC) 08/08/2017  . Examination of participant in clinical trial 08/08/2017  . History of cholecystectomy 08/08/2017  . Knee pain, left 05/13/2012    Past Surgical History:  Procedure Laterality Date  . CHOLECYSTECTOMY    . ESOPHAGOGASTRODUODENOSCOPY (EGD) WITH PROPOFOL N/A 12/19/2017   Procedure: ESOPHAGOGASTRODUODENOSCOPY (EGD) WITH PROPOFOL;  Surgeon: Kathi DerBrahmbhatt, Parag, MD;  Location: [facility];  Service: Gastroenterology;  Laterality: N/A;  . MOUTH SURGERY    . SAVORY DILATION N/A 12/19/2017   Procedure: SAVORY DILATION;  Surgeon: Kathi DerBrahmbhatt, Parag, MD;  Location: [facility];  Service: Gastroenterology;  Laterality: N/A;        Home Medications    Prior to Admission medications   Medication Sig Start Date End Date Taking? Authorizing Provider  cyclobenzaprine (FLEXERIL) 10 MG tablet Take 1 tablet (10 mg total) by mouth 2 (two) times daily as  needed for muscle spasms. 06/18/18   Rosario Duey, [place] N, PA-C    Family History No family history on file.  Social History Social History   Tobacco Use  . Smoking status: Current Every Day Smoker    Packs/day: 0.50    Types: Cigarettes  . Smokeless tobacco: Never Used  Substance Use Topics  . Alcohol use: No  . Drug use: Yes    Types: Marijuana     Allergies   Ultram [tramadol hcl]   Review of Systems Review of Systems  Musculoskeletal: Positive for myalgias. Negative for arthralgias.  Skin: Negative for color change.     Physical Exam Updated Vital Signs BP (!) 131/91 (BP Location: Left Arm)   Pulse 87   Temp 97.9 F (36.6 C) (Oral)   Resp 20   Ht 6' (1.829 m)   Wt 91 kg   SpO2 97%   BMI 27.21 kg/m   Physical Exam  Constitutional: He appears well-developed and well-nourished. No distress.  HENT:  Head: Normocephalic and atraumatic.  Eyes: Conjunctivae are normal.  Cardiovascular: Normal rate and intact distal pulses.  Pulmonary/Chest: Effort normal.  Musculoskeletal:       Legs: Tenderness to middle of right quadriceps muscle group. TTP to left mid-quadriceps and proximally towards groin. Active extension of b/l knees and flexion of hips, though this causes pain mostly on left side. Passive flexion of left knee also causes pain. Antalgic gait favoring left.  Psychiatric: He has a normal mood and affect. His behavior  is normal.  Nursing note and vitals reviewed.    ED Treatments / Results  Labs (all labs ordered are listed, but only abnormal results are displayed) Labs Reviewed - No data to display  EKG None  Radiology No results found.  Procedures Procedures (including critical care time)  Medications Ordered in ED Medications - No data to display   Initial Impression / Assessment and Plan / ED Course  I have reviewed the triage vital signs and the nursing notes.  Pertinent labs & imaging results that were available during my care of  the patient were reviewed by me and considered in my medical decision making (see chart for details).     Patient with likely muscle strain to bilateral quadricep muscles, left worse than right.  Patient playing softball about 2 weeks ago causing injury.  Has been treating symptomatically without much relief.  Will prescribe muscle relaxer and recommend follow-up with sports medicine.  Suggested very gentle stretching and gentle massage.  Oral hydration.  Safe for discharge at this time.  Discussed results, findings, treatment and follow up. Patient advised of return precautions. Patient verbalized understanding and agreed with plan.  Final Clinical Impressions(s) / ED Diagnoses   Final diagnoses:  Muscle strain of thigh, unspecified laterality, initial encounter    ED Discharge Orders         Ordered    cyclobenzaprine (FLEXERIL) 10 MG tablet  2 times daily PRN     06/18/18 2342           Jairy Angulo, [place] N, PA-C 06/18/18 2355    Arby Barrette, MD 06/19/18 2147

## 2018-07-18 ENCOUNTER — Encounter: Payer: Self-pay | Admitting: *Deleted

## 2018-07-18 ENCOUNTER — Ambulatory Visit (HOSPITAL_COMMUNITY): Payer: Self-pay

## 2018-07-25 ENCOUNTER — Emergency Department (HOSPITAL_COMMUNITY): Payer: Self-pay

## 2018-07-25 ENCOUNTER — Other Ambulatory Visit: Payer: Self-pay

## 2018-07-25 ENCOUNTER — Emergency Department (HOSPITAL_COMMUNITY)
Admission: EM | Admit: 2018-07-25 | Discharge: 2018-07-25 | Disposition: A | Discharge status: Home / Self Care | Payer: Self-pay | Attending: Emergency Medicine | Admitting: Emergency Medicine

## 2018-07-25 ENCOUNTER — Encounter (HOSPITAL_COMMUNITY): Payer: Self-pay | Admitting: Emergency Medicine

## 2018-07-25 DIAGNOSIS — Z79899 Other long term (current) drug therapy: Secondary | ICD-10-CM | POA: Insufficient documentation

## 2018-07-25 DIAGNOSIS — Y999 Unspecified external cause status: Secondary | ICD-10-CM | POA: Insufficient documentation

## 2018-07-25 DIAGNOSIS — F17210 Nicotine dependence, cigarettes, uncomplicated: Secondary | ICD-10-CM | POA: Insufficient documentation

## 2018-07-25 DIAGNOSIS — S32009A Unspecified fracture of unspecified lumbar vertebra, initial encounter for closed fracture: Secondary | ICD-10-CM | POA: Insufficient documentation

## 2018-07-25 DIAGNOSIS — Y9389 Activity, other specified: Secondary | ICD-10-CM | POA: Insufficient documentation

## 2018-07-25 DIAGNOSIS — Y929 Unspecified place or not applicable: Secondary | ICD-10-CM | POA: Insufficient documentation

## 2018-07-25 MED ORDER — MORPHINE SULFATE (PF) 4 MG/ML IV SOLN
6.0000 mg | Freq: Once | INTRAVENOUS | Status: AC
  Administered 2018-07-25: 6 mg via INTRAMUSCULAR
  Filled 2018-07-25: qty 2

## 2018-07-25 MED ORDER — MORPHINE SULFATE (PF) 4 MG/ML IV SOLN: 4.0000 mg | Freq: Once | INTRAVENOUS | Status: DC

## 2018-07-25 MED ORDER — METHOCARBAMOL 500 MG PO TABS: 500.0000 mg | ORAL_TABLET | Freq: Two times a day (BID) | ORAL | 0 refills | Status: AC

## 2018-07-25 MED ORDER — KETOROLAC TROMETHAMINE 30 MG/ML IJ SOLN
30.0000 mg | Freq: Once | INTRAMUSCULAR | Status: AC
  Administered 2018-07-25: 30 mg via INTRAMUSCULAR
  Filled 2018-07-25: qty 1

## 2018-07-25 MED ORDER — HYDROMORPHONE HCL 1 MG/ML IJ SOLN
1.0000 mg | Freq: Once | INTRAMUSCULAR | Status: AC
  Administered 2018-07-25: 1 mg via INTRAVENOUS
  Filled 2018-07-25: qty 1

## 2018-07-25 MED ORDER — ONDANSETRON 4 MG PO TBDP
4.0000 mg | ORAL_TABLET | Freq: Once | ORAL | Status: AC
  Administered 2018-07-25: 4 mg via ORAL
  Filled 2018-07-25: qty 1

## 2018-07-25 MED ORDER — IBUPROFEN 600 MG PO TABS: 600.0000 mg | ORAL_TABLET | Freq: Four times a day (QID) | ORAL | 0 refills | Status: AC | PRN

## 2018-07-25 MED ORDER — OXYCODONE-ACETAMINOPHEN 5-325 MG PO TABS: 1.0000 | ORAL_TABLET | Freq: Four times a day (QID) | ORAL | 0 refills | Status: AC | PRN

## 2018-07-25 MED ORDER — ONDANSETRON 4 MG PO TBDP: 4.0000 mg | ORAL_TABLET | Freq: Three times a day (TID) | ORAL | 0 refills | Status: AC | PRN

## 2018-07-25 NOTE — ED Notes (Signed)
Patient transported to CT 

## 2018-07-25 NOTE — ED Notes (Signed)
Patient verbalizes understanding of discharge instructions. Opportunity for questioning and answers were provided. Armband removed by staff, pt discharged from ED. Pt wheeled to lobby and taken home by family 
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
## 2018-07-25 NOTE — Progress Notes (Signed)
Orthopedic Tech Progress Note Patient Details:  Jon Simmons 1966/07/31 [id number]  Patient ID: [name], male   DOB: 1966-07-07, 53 y.o.   MRN: [id number]   [name] 07/25/2018, 7:33 AM Called in bio-tech brace order; spoke with answering service

## 2018-07-25 NOTE — Discharge Instructions (Addendum)
Take Robaxin twice daily as needed for muscle pain or spasms.  Take ibuprofen every 6 hours as needed for your pain.  For severe, breakthrough pain, take 1-2 Percocet every 6 hours. Take Zofran every 8 hours or nausea or vomiting. Please follow-up with the spine doctor for further evaluation and treatment.  Please return the emergency department if you develop any new or worsening symptoms.  Avoid heavy lifting and strenuous activities until cleared by neurosurgery.  Do not drink alcohol, drive, operate machinery or participate in any other potentially dangerous activities while taking opiate pain medication as it may make you sleepy. Do not take this medication with any other sedating medications, either prescription or over-the-counter. If you were prescribed Percocet or Vicodin, do not take these with acetaminophen (Tylenol) as it is already contained within these medications and overdose of Tylenol is dangerous.   This medication is an opiate (or narcotic) pain medication and can be habit forming.  Use it as little as possible to achieve adequate pain control.  Do not use or use it with extreme caution if you have a history of opiate abuse or dependence. This medication is intended for your use only - do not give any to anyone else and keep it in a secure place where nobody else, especially children, have access to it. It will also cause or worsen constipation, so you may want to consider taking an over-the-counter stool softener while you are taking this medication.

## 2018-07-25 NOTE — ED Notes (Signed)
Spoke with Ortho tech to order TLSO brace. Office that manufactures the brace does not open until 9am

## 2018-07-25 NOTE — ED Notes (Signed)
RN confirmed pt was able to ambulate and urinate independently.
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
## 2018-07-31 ENCOUNTER — Encounter: Payer: Self-pay | Admitting: *Deleted

## 2018-07-31 ENCOUNTER — Encounter (INDEPENDENT_AMBULATORY_CARE_PROVIDER_SITE_OTHER): Payer: Self-pay | Admitting: *Deleted

## 2018-07-31 ENCOUNTER — Ambulatory Visit (HOSPITAL_COMMUNITY): Payer: Self-pay

## 2018-07-31 VITALS — BP 133/89 | HR 84 | Temp 98.0°F | Wt 212.2 lb

## 2018-07-31 DIAGNOSIS — Z006 Encounter for examination for normal comparison and control in clinical research program: Secondary | ICD-10-CM
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
## 2018-08-01 ENCOUNTER — Ambulatory Visit (HOSPITAL_COMMUNITY): Payer: Self-pay

## 2018-08-02 LAB — COMPREHENSIVE METABOLIC PANEL
AG Ratio: 1.8 (calc) (ref 1.0–2.5)
ALBUMIN MSPROF: 4.3 g/dL (ref 3.6–5.1)
ALT: 14 U/L (ref 9–46)
AST: 13 U/L (ref 10–35)
Alkaline phosphatase (APISO): 54 U/L (ref 40–115)
BUN: 17 mg/dL (ref 7–25)
CHLORIDE: 103 mmol/L (ref 98–110)
CO2: 27 mmol/L (ref 20–32)
Calcium: 9.4 mg/dL (ref 8.6–10.3)
Creat: 0.98 mg/dL (ref 0.70–1.33)
GLOBULIN: 2.4 g/dL (ref 1.9–3.7)
GLUCOSE: 85 mg/dL (ref 65–99)
POTASSIUM: 5 mmol/L (ref 3.5–5.3)
Sodium: 139 mmol/L (ref 135–146)
Total Bilirubin: 0.4 mg/dL (ref 0.2–1.2)
Total Protein: 6.7 g/dL (ref 6.1–8.1)

## 2018-08-02 LAB — CBC WITH DIFFERENTIAL/PLATELET
BASOS ABS: 49 {cells}/uL (ref 0–200)
Basophils Relative: 0.6 %
Eosinophils Absolute: 405 cells/uL (ref 15–500)
Eosinophils Relative: 5 %
HEMATOCRIT: 45.4 % (ref 38.5–50.0)
Hemoglobin: 15.7 g/dL (ref 13.2–17.1)
LYMPHS ABS: 3750 {cells}/uL (ref 850–3900)
MCH: 30.1 pg (ref 27.0–33.0)
MCHC: 34.6 g/dL (ref 32.0–36.0)
MCV: 87 fL (ref 80.0–100.0)
MPV: 10.3 fL (ref 7.5–12.5)
Monocytes Relative: 7.1 %
NEUTROS PCT: 41 %
Neutro Abs: 3321 cells/uL (ref 1500–7800)
Platelets: 272 10*3/uL (ref 140–400)
RBC: 5.22 10*6/uL (ref 4.20–5.80)
RDW: 13.2 % (ref 11.0–15.0)
TOTAL LYMPHOCYTE: 46.3 %
WBC: 8.1 10*3/uL (ref 3.8–10.8)
WBCMIX: 575 {cells}/uL (ref 200–950)

## 2018-08-02 LAB — PROTIME-INR
INR: 1
PROTHROMBIN TIME: 10.4 s (ref 9.0–11.5)

## 2018-08-02 LAB — HEPATITIS C RNA QUANTITATIVE
HCV QUANT LOG: NOT DETECTED {Log_IU}/mL
HCV RNA, PCR, QN: NOT DETECTED [IU]/mL

## 2018-08-09 ENCOUNTER — Encounter: Payer: Self-pay | Admitting: *Deleted

## 2018-08-09 LAB — HEPATITIS C RNA QUANTITATIVE: Hepatitis C RNA-PCR: <15

## 2018-09-03 ENCOUNTER — Other Ambulatory Visit: Payer: Self-pay | Admitting: *Deleted

## 2018-09-03 DIAGNOSIS — Z006 Encounter for examination for normal comparison and control in clinical research program: Secondary | ICD-10-CM

## 2018-09-06 ENCOUNTER — Ambulatory Visit (HOSPITAL_COMMUNITY): Payer: Self-pay

## 2018-09-10 ENCOUNTER — Ambulatory Visit (HOSPITAL_COMMUNITY)
Admission: RE | Admit: 2018-09-10 | Discharge: 2018-09-10 | Disposition: A | Discharge status: Home / Self Care | Payer: Self-pay | Source: Ambulatory Visit | Attending: Infectious Disease | Admitting: Infectious Disease

## 2018-09-10 DIAGNOSIS — Z006 Encounter for examination for normal comparison and control in clinical research program: Secondary | ICD-10-CM | POA: Insufficient documentation

## 2018-12-12 ENCOUNTER — Other Ambulatory Visit: Payer: Self-pay | Admitting: *Deleted

## 2018-12-12 DIAGNOSIS — Z006 Encounter for examination for normal comparison and control in clinical research program: Secondary | ICD-10-CM

## 2018-12-19 ENCOUNTER — Encounter: Payer: Self-pay | Admitting: *Deleted

## 2019-01-04 IMAGING — US US ABDOMEN COMPLETE W/ ELASTOGRAPHY
1 series · 13 of 25 positions shown · non-contrast
Comparison: None.

CLINICAL DATA: Research study, cholecystectomy



[Series 1: us abdomen complete w/ elastography · 0.23mm/px · 13 of 75 slices shown]
[im 1/75]
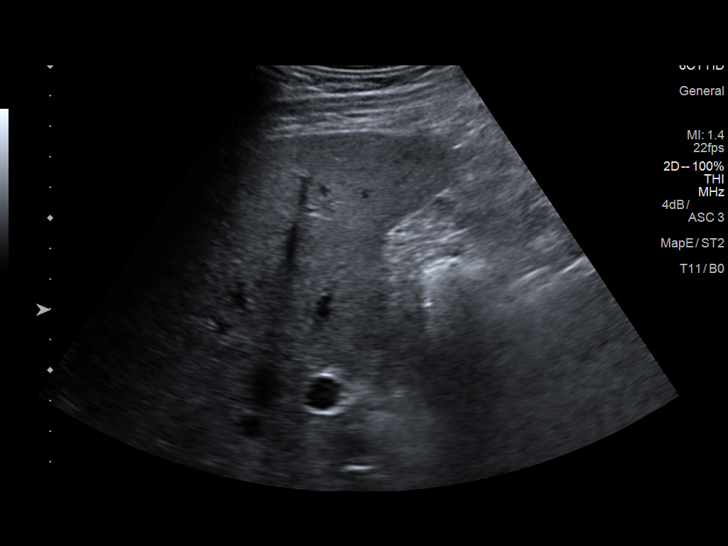
[im 7/75]
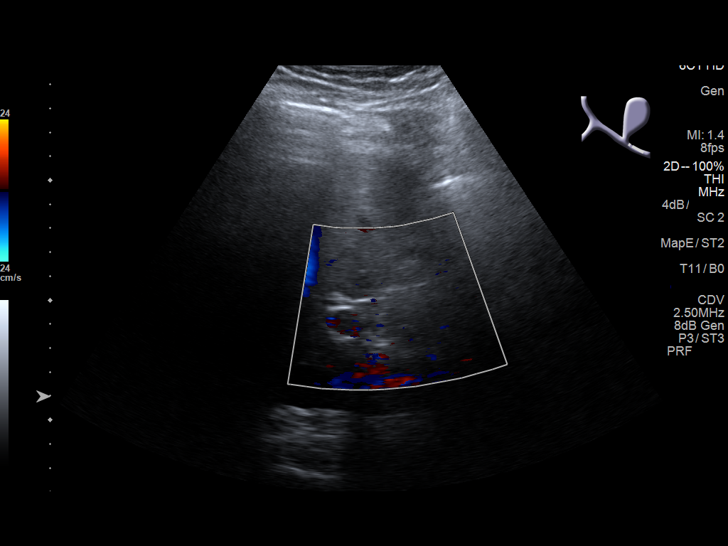
[im 13/75]
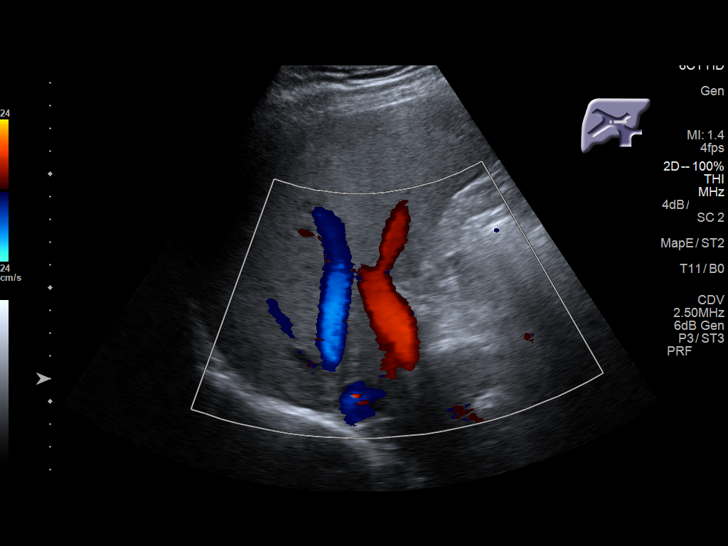
[im 19/75]
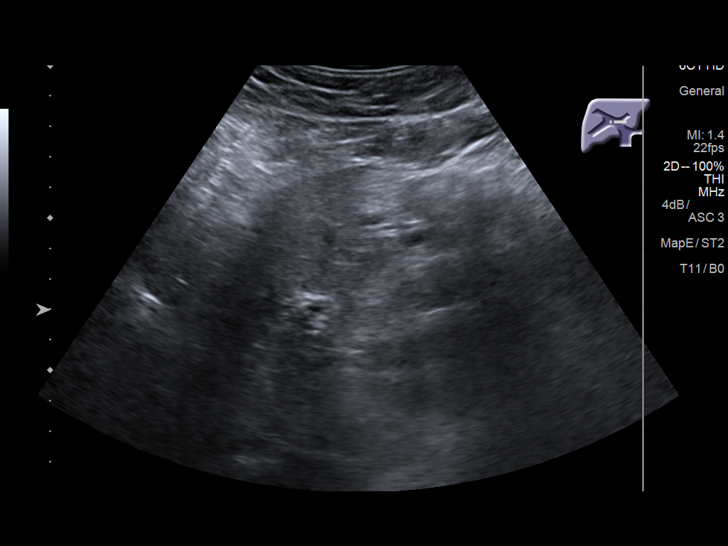
[im 25/75]
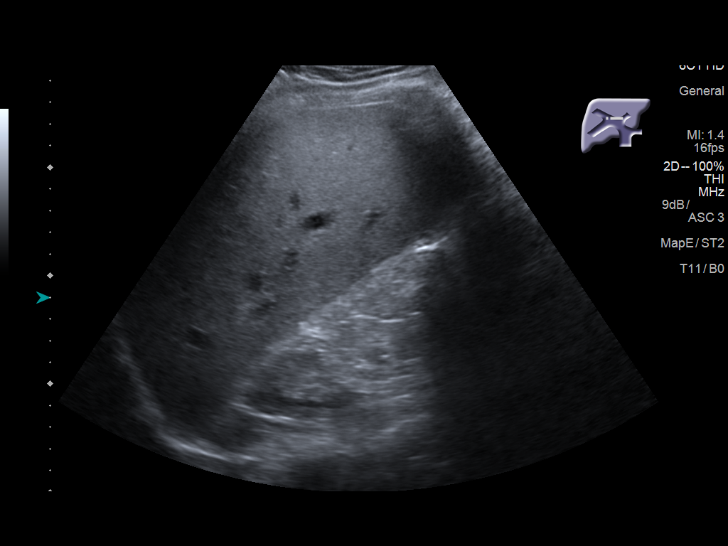
[im 31/75]
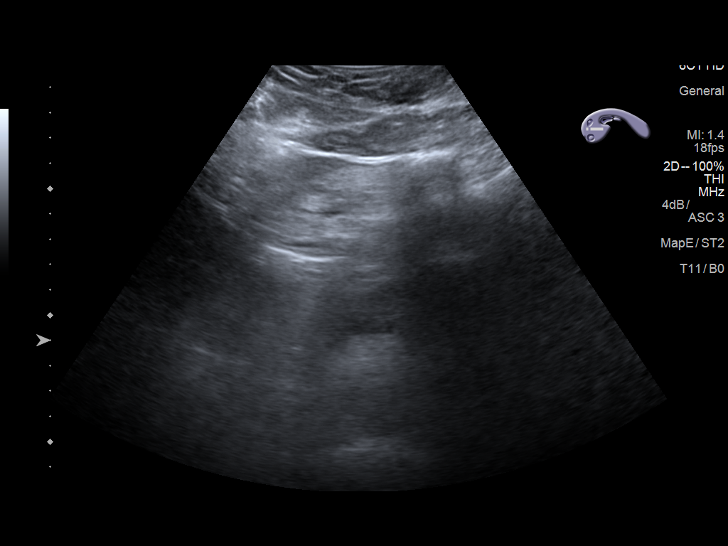
[im 38/75]
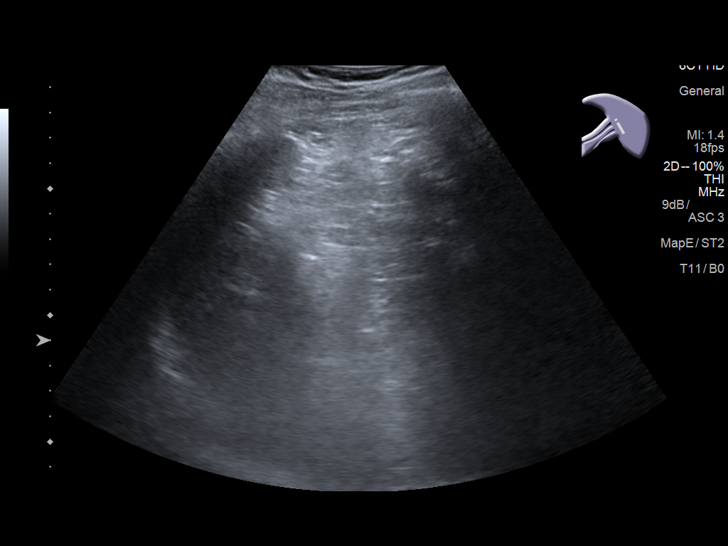
[im 44/75]
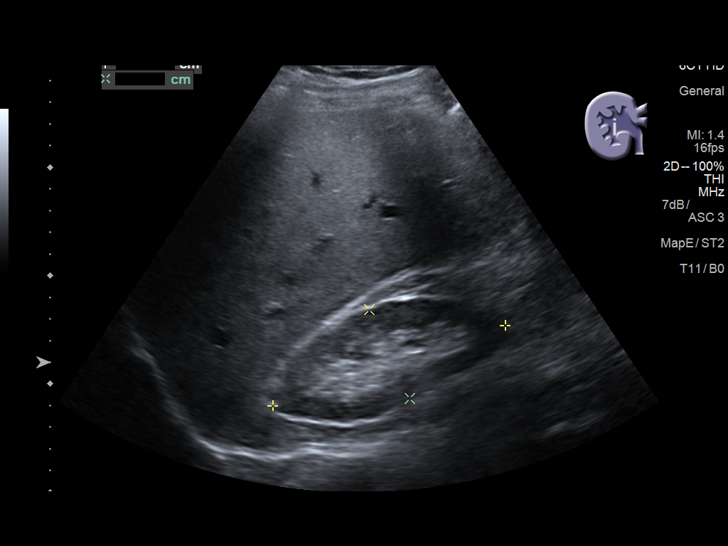
[im 50/75]
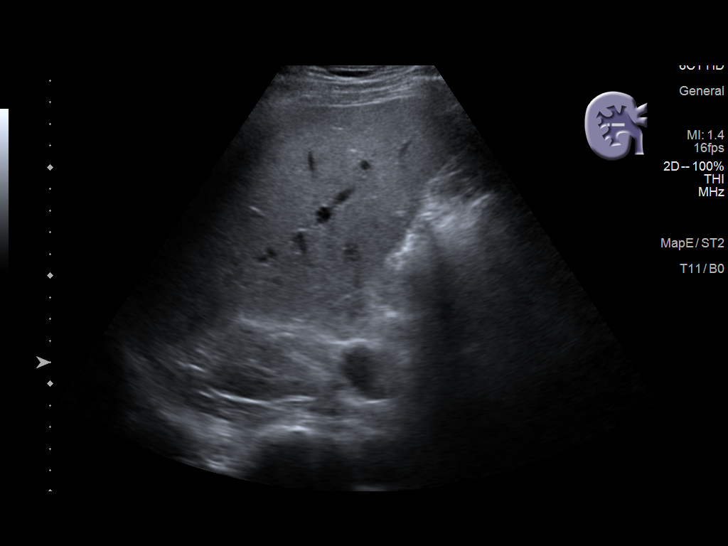
[im 56/75]
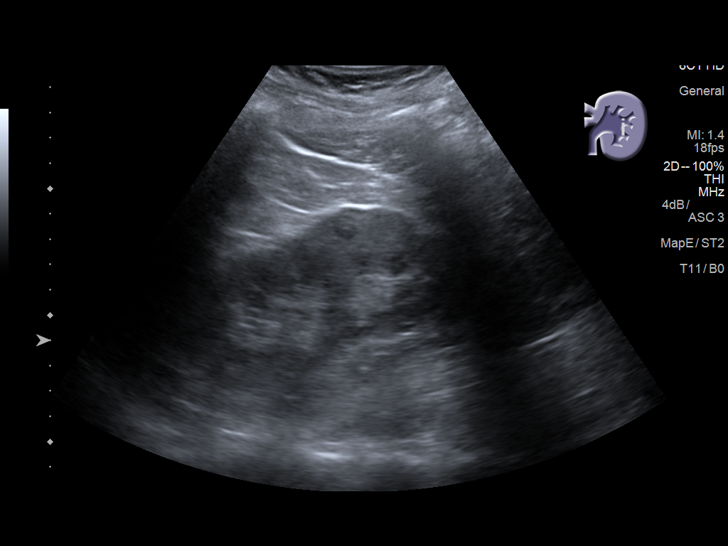
[im 62/75]
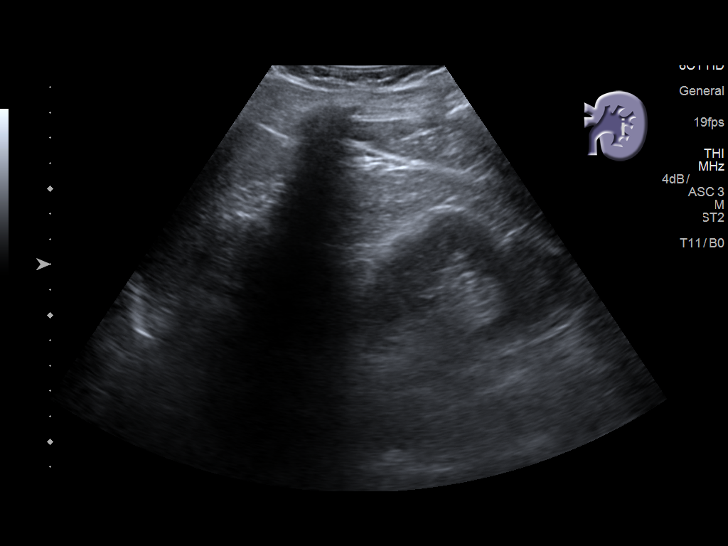
[im 68/75]
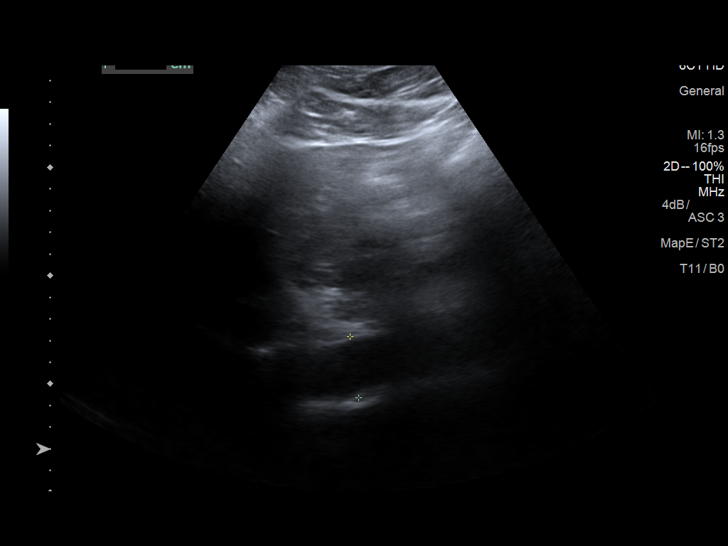
[im 75/75]
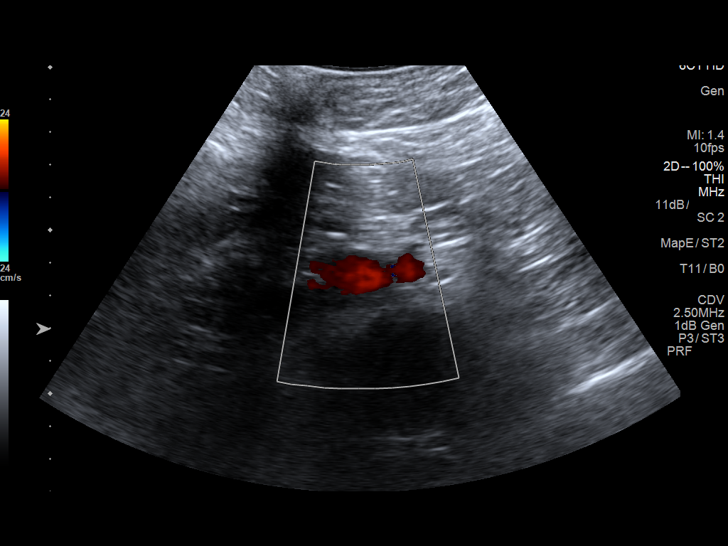

[13 of 25 positions shown; findings below may reference images not displayed]

FINDINGS: ULTRASOUND ABDOMEN

Gallbladder: Prior cholecystectomy

Common bile duct: Diameter: Normal caliber, 3 mm

Liver: No focal lesion identified. Within normal limits in
parenchymal echogenicity. Portal vein is patent on color Doppler
imaging with normal direction of blood flow towards the liver.

IVC: No abnormality visualized.

Pancreas: Visualized portion unremarkable.

Spleen: Size and appearance within normal limits.

Right Kidney: Length: 11.4 cm. Echogenicity within normal limits. No
mass or hydronephrosis visualized.

Left Kidney: Length: 12.8 cm. Echogenicity within normal limits. No
mass or hydronephrosis visualized.

Abdominal aorta: No aneurysm visualized.

Other findings: None.

ULTRASOUND HEPATIC ELASTOGRAPHY

Device: Siemens Helix VTQ

Patient position: Supine

Transducer 6C1

Number of measurements: 10

Hepatic segment:  8

Median velocity:   0.99 m/sec

IQR:

IQR/Median velocity ratio:

Corresponding Metavir fibrosis score:  F0/F1

Risk of fibrosis: Minimal

Limitations of exam: None

Please note that abnormal shear wave velocities may also be
identified in clinical settings other than with hepatic fibrosis,
such as: acute hepatitis, elevated right heart and central venous
pressures including use of beta blockers, Oni disease
(Nivirus), infiltrative processes such as
mastocytosis/amyloidosis/infiltrative tumor, extrahepatic
cholestasis, in the post-prandial state, and liver transplantation.
Correlation with patient history, laboratory data, and clinical
condition recommended.
IMPRESSION: ULTRASOUND ABDOMEN: No acute findings.

ULTRASOUND HEPATIC ELASTOGRAPHY:

Median hepatic shear wave velocity is calculated at 0.99 m/sec.

Corresponding Metavir fibrosis score is F0/F1.

Risk of fibrosis is Minimal.

Follow-up: None required

## 2019-04-14 ENCOUNTER — Other Ambulatory Visit: Payer: Self-pay | Admitting: *Deleted

## 2019-04-14 DIAGNOSIS — Z006 Encounter for examination for normal comparison and control in clinical research program: Secondary | ICD-10-CM

## 2019-04-25 ENCOUNTER — Encounter: Payer: Self-pay | Admitting: *Deleted

## 2019-04-25 ENCOUNTER — Ambulatory Visit (HOSPITAL_COMMUNITY): Payer: Self-pay

## 2019-05-13 ENCOUNTER — Other Ambulatory Visit: Payer: Self-pay | Admitting: *Deleted

## 2019-05-19 ENCOUNTER — Encounter: Payer: Self-pay | Admitting: *Deleted

## 2019-05-19 ENCOUNTER — Ambulatory Visit (HOSPITAL_COMMUNITY): Payer: Self-pay

## 2019-05-27 ENCOUNTER — Other Ambulatory Visit: Payer: Self-pay

## 2019-05-27 ENCOUNTER — Encounter (INDEPENDENT_AMBULATORY_CARE_PROVIDER_SITE_OTHER): Payer: Self-pay | Admitting: *Deleted

## 2019-05-27 ENCOUNTER — Ambulatory Visit (HOSPITAL_COMMUNITY)
Admission: RE | Admit: 2019-05-27 | Discharge: 2019-05-27 | Disposition: A | Discharge status: Home / Self Care | Payer: Self-pay | Source: Ambulatory Visit | Attending: Infectious Disease | Admitting: Infectious Disease

## 2019-05-27 VITALS — BP 143/93 | HR 76 | Temp 98.0°F

## 2019-05-27 DIAGNOSIS — Z006 Encounter for examination for normal comparison and control in clinical research program: Secondary | ICD-10-CM
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
## 2019-05-28 LAB — COMPREHENSIVE METABOLIC PANEL
AG Ratio: 2 (calc) (ref 1.0–2.5)
ALT: 10 U/L (ref 9–46)
AST: 13 U/L (ref 10–35)
Albumin: 4.6 g/dL (ref 3.6–5.1)
Alkaline phosphatase (APISO): 57 U/L (ref 35–144)
BUN: 12 mg/dL (ref 7–25)
CO2: 26 mmol/L (ref 20–32)
Calcium: 9.7 mg/dL (ref 8.6–10.3)
Chloride: 106 mmol/L (ref 98–110)
Creat: 0.99 mg/dL (ref 0.70–1.33)
Globulin: 2.3 g/dL (calc) (ref 1.9–3.7)
Glucose, Bld: 93 mg/dL (ref 65–99)
Potassium: 4.4 mmol/L (ref 3.5–5.3)
Sodium: 139 mmol/L (ref 135–146)
Total Bilirubin: 0.5 mg/dL (ref 0.2–1.2)
Total Protein: 6.9 g/dL (ref 6.1–8.1)

## 2019-05-28 LAB — CBC WITH DIFFERENTIAL/PLATELET
Absolute Monocytes: 393 cells/uL (ref 200–950)
Basophils Absolute: 46 cells/uL (ref 0–200)
Basophils Relative: 0.6 %
Eosinophils Absolute: 177 cells/uL (ref 15–500)
Eosinophils Relative: 2.3 %
HCT: 50.4 % — ABNORMAL HIGH (ref 38.5–50.0)
Hemoglobin: 16.9 g/dL (ref 13.2–17.1)
Lymphs Abs: 3196 cells/uL (ref 850–3900)
MCH: 30.2 pg (ref 27.0–33.0)
MCHC: 33.5 g/dL (ref 32.0–36.0)
MCV: 90 fL (ref 80.0–100.0)
MPV: 10.1 fL (ref 7.5–12.5)
Monocytes Relative: 5.1 %
Neutro Abs: 3889 cells/uL (ref 1500–7800)
Neutrophils Relative %: 50.5 %
Platelets: 229 10*3/uL (ref 140–400)
RBC: 5.6 10*6/uL (ref 4.20–5.80)
RDW: 13.8 % (ref 11.0–15.0)
Total Lymphocyte: 41.5 %
WBC: 7.7 10*3/uL (ref 3.8–10.8)

## 2019-05-28 LAB — PROTIME-INR
INR: 1.1
Prothrombin Time: 11.2 s (ref 9.0–11.5)

## 2019-05-31 LAB — HEPATITIS C RNA QUANTITATIVE: Hepatitis C RNA-PCR: <15

## 2019-06-11 ENCOUNTER — Encounter: Payer: Self-pay | Admitting: *Deleted

## 2019-11-21 ENCOUNTER — Ambulatory Visit: Payer: No Typology Code available for payment source

## 2019-11-28 ENCOUNTER — Ambulatory Visit: Payer: Self-pay | Attending: Internal Medicine

## 2019-11-28 DIAGNOSIS — Z23 Encounter for immunization: Secondary | ICD-10-CM | POA: Insufficient documentation

## 2019-11-28 NOTE — Progress Notes (Signed)
   Covid-19 Vaccination Clinic  Name:  Jon Simmons    MRN: [id number] DOB: 15-Mar-1966  11/28/2019  Jon Simmons was observed post Covid-19 immunization for 15 minutes without incident. He was provided with Vaccine Information Sheet and instruction to access the V-Safe system.   Jon Simmons was instructed to call 911 with any severe reactions post vaccine: Marland Kitchen Difficulty breathing  . Swelling of face and throat  . A fast heartbeat  . A bad rash all over body  . Dizziness and weakness   Immunizations Administered    Name Date Dose VIS Date Route   Pfizer COVID-19 Vaccine 11/28/2019 11:24 AM 0.3 mL 09/05/2019 Intramuscular   Manufacturer: [employer], [employer]   Lot: [id number]   NDC: [address]-7

## 2019-12-24 ENCOUNTER — Ambulatory Visit: Payer: Self-pay | Attending: Internal Medicine

## 2019-12-24 DIAGNOSIS — Z23 Encounter for immunization: Secondary | ICD-10-CM

## 2019-12-24 NOTE — Progress Notes (Signed)
   Covid-19 Vaccination Clinic  Name:  Kal Chait    MRN: [id number] DOB: Jul 24, 1966  12/24/2019  Mr. Misch was observed post Covid-19 immunization for 15 minutes without incident. He was provided with Vaccine Information Sheet and instruction to access the V-Safe system.   Mr. Palinkas was instructed to call 911 with any severe reactions post vaccine: Marland Kitchen Difficulty breathing  . Swelling of face and throat  . A fast heartbeat  . A bad rash all over body  . Dizziness and weakness   Immunizations Administered    Name Date Dose VIS Date Route   Pfizer COVID-19 Vaccine 12/24/2019 11:03 AM 0.3 mL 09/05/2019 Intramuscular   Manufacturer: [employer], [employer]   Lot: [id number]   NDC: [address]-2

## 2020-04-16 IMAGING — US US ABDOMEN COMPLETE W/ ELASTOGRAPHY
2 series · 13 of 25 positions shown · non-contrast
Comparison: 09/10/2018

CLINICAL DATA: Research study, prior cholecystectomy



[Series 1: us abdomen complete w/ elastography · 11 of 70 slices shown (1 of 2)]
[im 1/70]
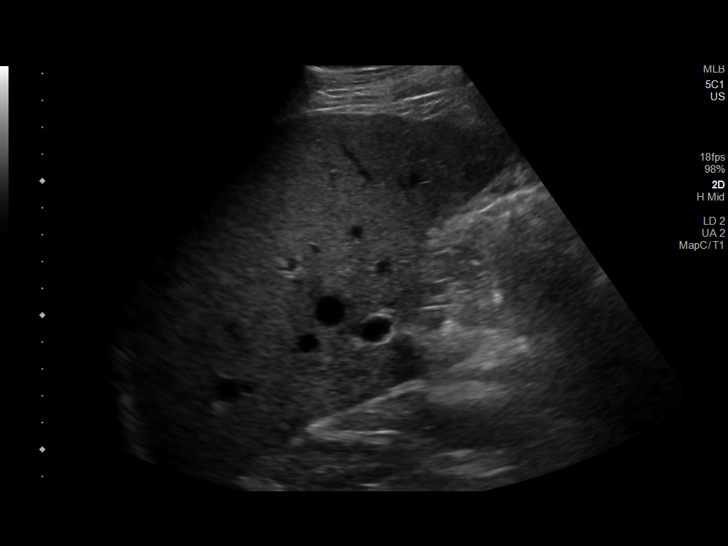
[im 7/70]
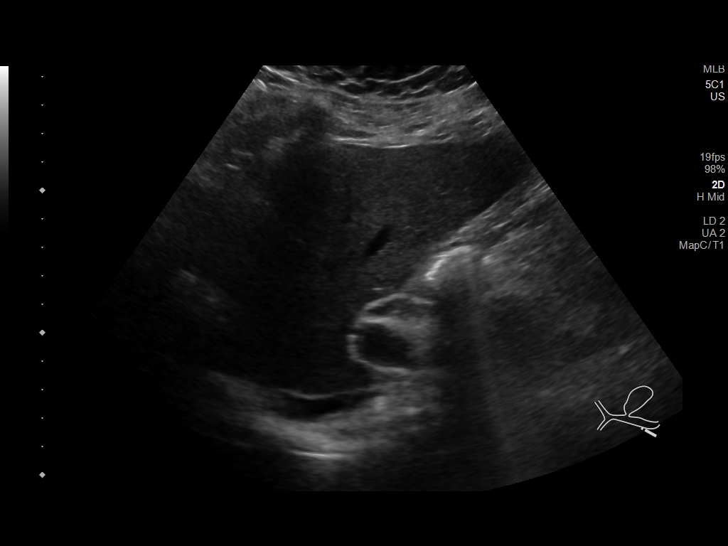
[im 14/70]
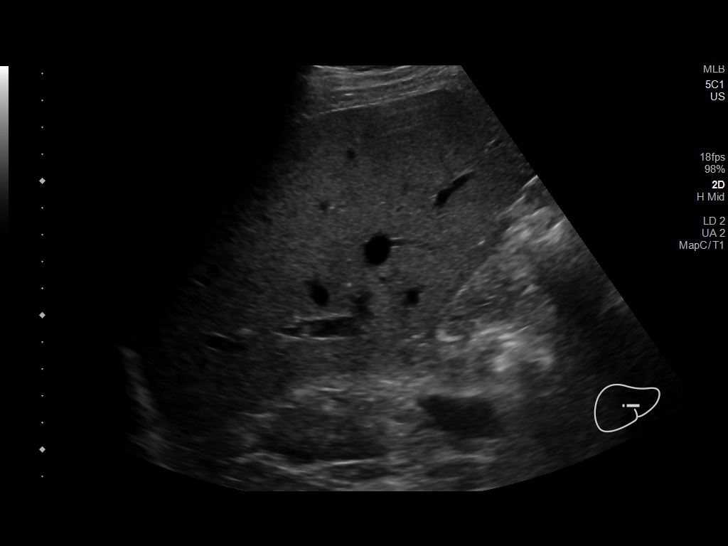
[im 21/70]
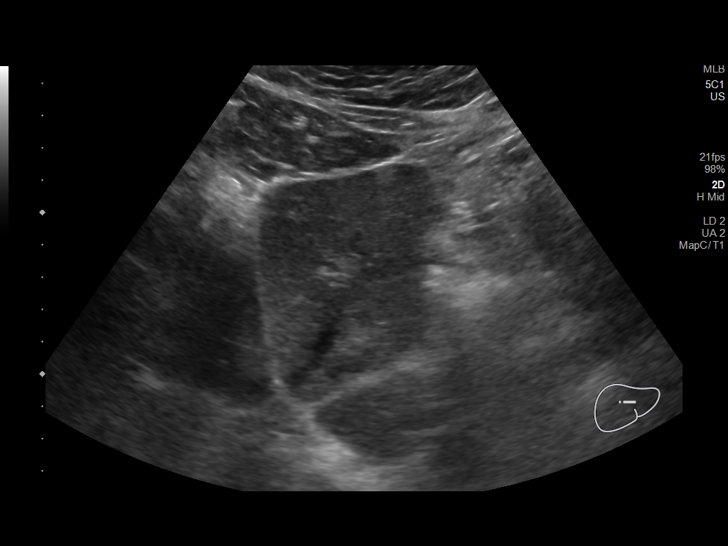
[im 28/70]
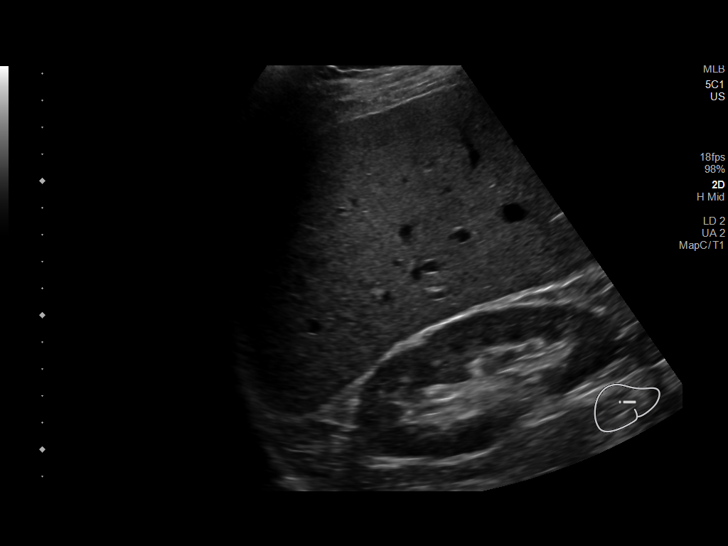
[im 35/70]
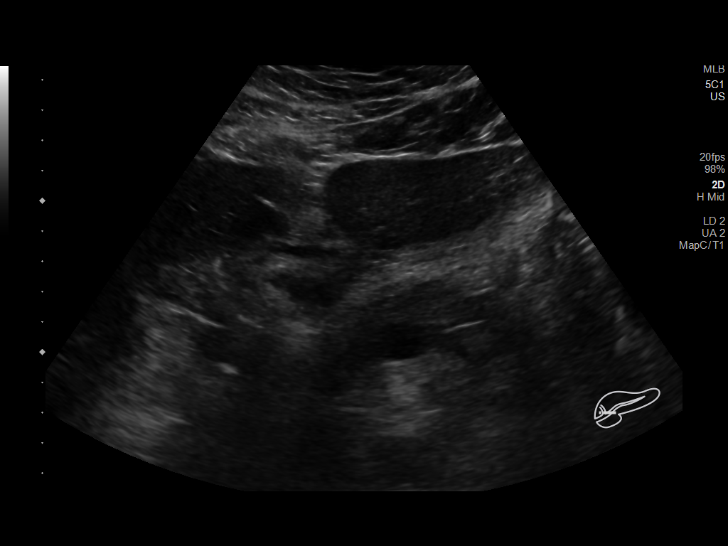
[im 42/70]
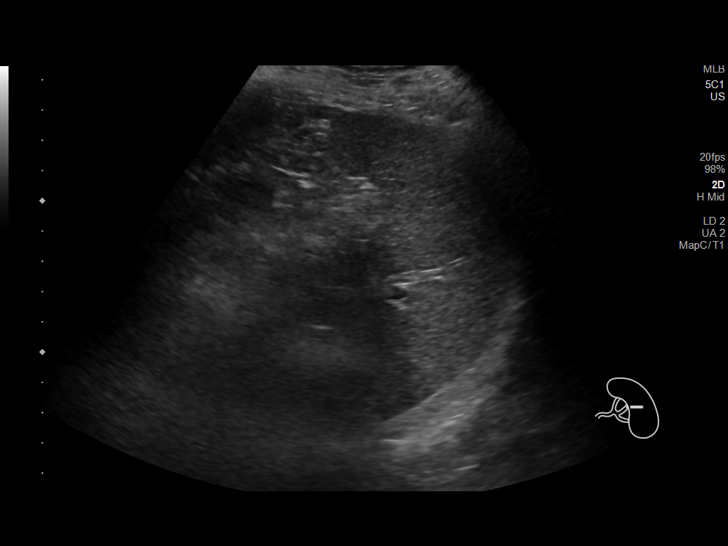
[im 49/70]
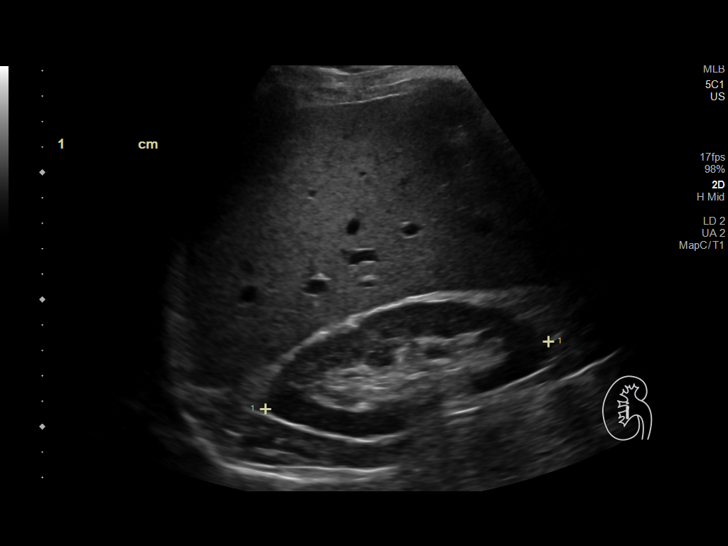
[im 56/70]
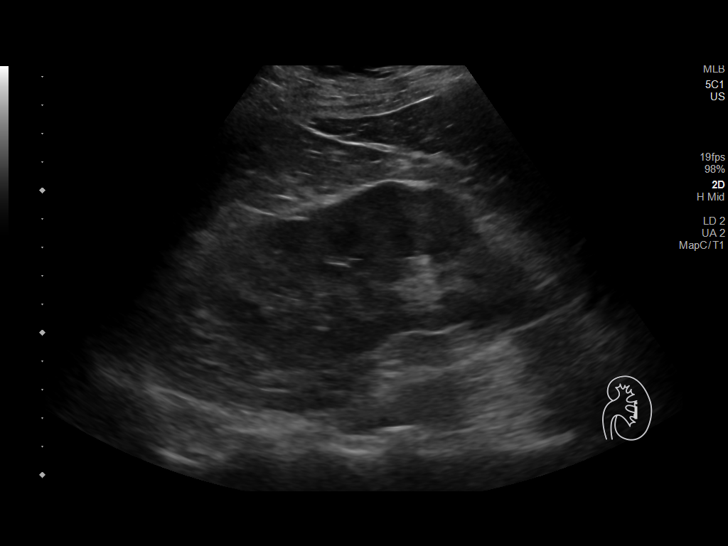
[im 63/70]
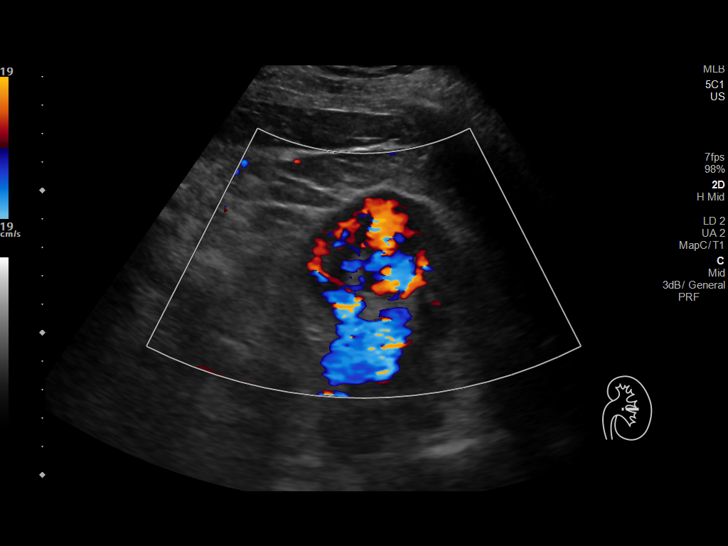
[im 70/70]
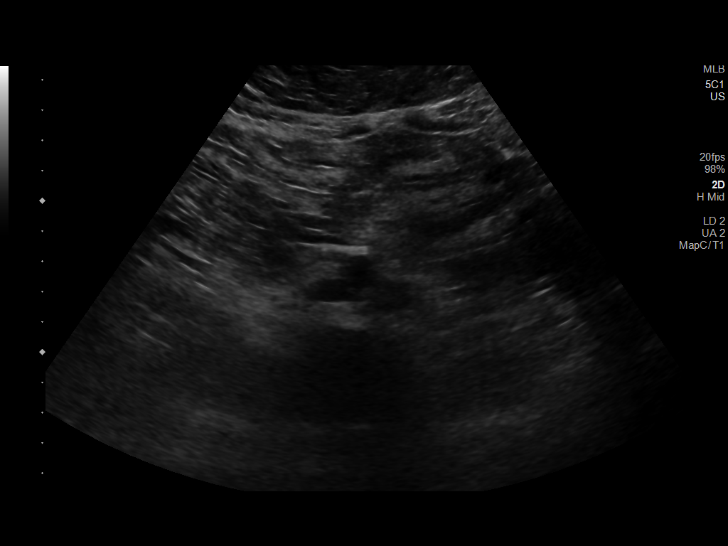

[Series 2: us abdomen complete w/ elastography · 2 of 13 slices shown (2 of 2)]
[im 5/13]
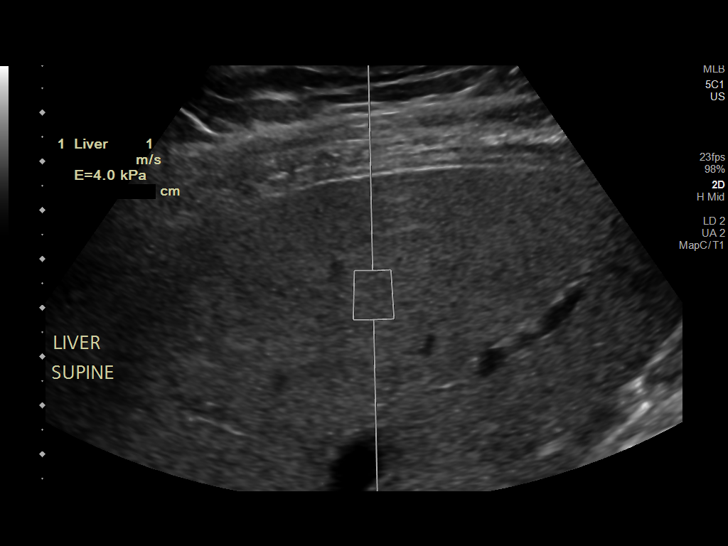
[im 13/13]
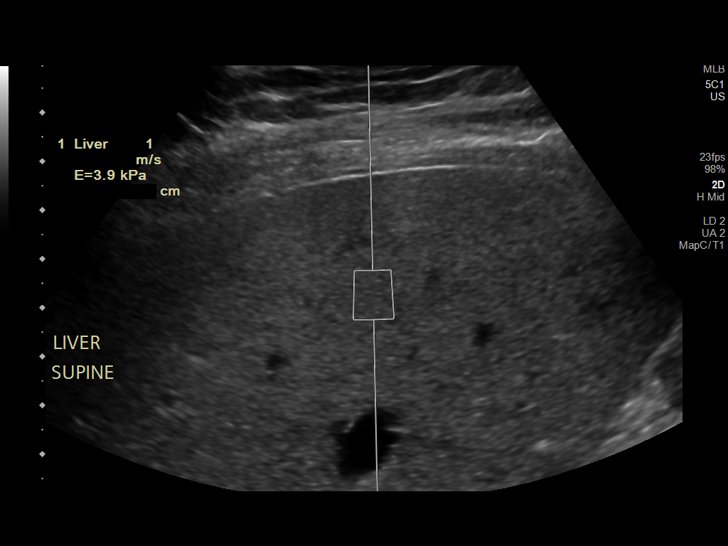

[13 of 25 positions shown; findings below may reference images not displayed]

FINDINGS: ULTRASOUND ABDOMEN

Gallbladder: Surgically absent.

Common bile duct: Diameter: 3 mm

Liver: No focal lesion identified. Within normal limits in
parenchymal echogenicity. Portal vein is patent on color Doppler
imaging with normal direction of blood flow towards the liver.

IVC: No abnormality visualized.

Pancreas: Visualized portion unremarkable.

Spleen: Size and appearance within normal limits.

Right Kidney: Length: 11.4 cm. Echogenicity within normal limits. No
mass or hydronephrosis visualized.

Left Kidney: Length: 12.9 cm. Echogenicity within normal limits. No
mass or hydronephrosis visualized.

Abdominal aorta: No aneurysm visualized.

Other findings: None.

ULTRASOUND HEPATIC ELASTOGRAPHY

Device: Siemens Helix VTQ

Patient position: Supine

Transducer 5C1

Number of measurements: 10

Hepatic segment:  8

Median velocity:   1.17 m/sec

IQR:

IQR/Median velocity ratio:

Corresponding Metavir fibrosis score:  F0/F1

Risk of fibrosis: Minimal

Limitations of exam: None

Please note that abnormal shear wave velocities may also be
identified in clinical settings other than with hepatic fibrosis,
such as: acute hepatitis, elevated right heart and central venous
pressures including use of beta blockers, [place] disease
(Sosa), infiltrative processes such as
mastocytosis/amyloidosis/infiltrative tumor, extrahepatic
cholestasis, in the post-prandial state, and liver transplantation.
Correlation with patient history, laboratory data, and clinical
condition recommended.
IMPRESSION: ULTRASOUND ABDOMEN: Status post cholecystectomy. Otherwise negative
abdominal ultrasound.

ULTRASOUND HEPATIC ELASTOGRAPHY:

Median hepatic shear wave velocity is calculated at 1.17 m/sec.

Corresponding Metavir fibrosis score is F0/F1.

Risk of fibrosis is Minimal.

Follow-up: None required

## 2021-10-18 ENCOUNTER — Emergency Department (HOSPITAL_BASED_OUTPATIENT_CLINIC_OR_DEPARTMENT_OTHER): Payer: BLUE CROSS/BLUE SHIELD

## 2021-10-18 ENCOUNTER — Other Ambulatory Visit: Payer: Self-pay

## 2021-10-18 ENCOUNTER — Encounter (HOSPITAL_BASED_OUTPATIENT_CLINIC_OR_DEPARTMENT_OTHER): Payer: Self-pay | Admitting: Urology

## 2021-10-18 ENCOUNTER — Emergency Department (HOSPITAL_BASED_OUTPATIENT_CLINIC_OR_DEPARTMENT_OTHER)
Admission: EM | Admit: 2021-10-18 | Discharge: 2021-10-19 | Disposition: A | Discharge status: Home / Self Care | Payer: BLUE CROSS/BLUE SHIELD | Attending: Emergency Medicine | Admitting: Emergency Medicine

## 2021-10-18 DIAGNOSIS — E876 Hypokalemia: Secondary | ICD-10-CM | POA: Insufficient documentation

## 2021-10-18 DIAGNOSIS — R55 Syncope and collapse: Secondary | ICD-10-CM | POA: Insufficient documentation

## 2021-10-18 DIAGNOSIS — Z79899 Other long term (current) drug therapy: Secondary | ICD-10-CM | POA: Diagnosis not present

## 2021-10-18 DIAGNOSIS — Z20822 Contact with and (suspected) exposure to covid-19: Secondary | ICD-10-CM | POA: Insufficient documentation

## 2021-10-18 DIAGNOSIS — F17210 Nicotine dependence, cigarettes, uncomplicated: Secondary | ICD-10-CM | POA: Insufficient documentation

## 2021-10-18 LAB — CBC
HCT: 48 % (ref 39.0–52.0)
Hemoglobin: 16.5 g/dL (ref 13.0–17.0)
MCH: 30.8 pg (ref 26.0–34.0)
MCHC: 34.4 g/dL (ref 30.0–36.0)
MCV: 89.6 fL (ref 80.0–100.0)
Platelets: 252 10*3/uL (ref 150–400)
RBC: 5.36 MIL/uL (ref 4.22–5.81)
RDW: 13.6 % (ref 11.5–15.5)
WBC: 9.4 10*3/uL (ref 4.0–10.5)
nRBC: 0 % (ref 0.0–0.2)

## 2021-10-18 LAB — BASIC METABOLIC PANEL
Anion gap: 10 (ref 5–15)
BUN: 12 mg/dL (ref 6–20)
CO2: 24 mmol/L (ref 22–32)
Calcium: 8.9 mg/dL (ref 8.9–10.3)
Chloride: 102 mmol/L (ref 98–111)
Creatinine, Ser: 1.27 mg/dL — ABNORMAL HIGH (ref 0.61–1.24)
GFR, Estimated: >60 mL/min (ref >60)
Glucose, Bld: 128 mg/dL — ABNORMAL HIGH (ref 70–99)
Potassium: 3.3 mmol/L — ABNORMAL LOW (ref 3.5–5.1)
Sodium: 136 mmol/L (ref 135–145)

## 2021-10-18 LAB — RESP PANEL BY RT-PCR (FLU A&B, COVID) ARPGX2
Influenza A by PCR: NEGATIVE
Influenza B by PCR: NEGATIVE
SARS Coronavirus 2 by RT PCR: NEGATIVE

## 2021-10-18 MED ORDER — SODIUM CHLORIDE 0.9 % IV BOLUS
1000.0000 mL | Freq: Once | INTRAVENOUS | Status: AC
  Administered 2021-10-18: 23:00:00 1000 mL via INTRAVENOUS

## 2021-10-18 MED ORDER — POTASSIUM CHLORIDE CRYS ER 20 MEQ PO TBCR
40.0000 meq | EXTENDED_RELEASE_TABLET | Freq: Once | ORAL | Status: AC
Start: 2021-10-18 — End: 2021-10-18
  Administered 2021-10-18: 40 meq via ORAL
  Filled 2021-10-18: qty 2

## 2021-10-18 MED ORDER — LORAZEPAM 2 MG/ML IJ SOLN
1.0000 mg | Freq: Once | INTRAMUSCULAR | Status: AC
  Administered 2021-10-18: 23:00:00 1 mg via INTRAVENOUS
  Filled 2021-10-18: qty 1

## 2021-10-18 NOTE — ED Notes (Addendum)
Pt laid flat for orthostatics 

## 2021-10-18 NOTE — ED Notes (Signed)
Due to pt being dizzy 3 min orthostatic while standing was not done.

## 2021-10-18 NOTE — ED Triage Notes (Signed)
Syncopal episode approx 1 hr PTA States body aches and chills that started as well  H/o MI Denies chest pain A &O x 4 now

## 2021-10-18 NOTE — ED Provider Notes (Signed)
[facility] EMERGENCY DEPARTMENT Provider Note   CSN: [id number] Arrival date & time: 10/18/21  2159     History  Chief Complaint  Patient presents with   Loss of Consciousness    Jon Simmons is a 56 y.o. male.  56 year old male reports feeling dizzy (spinning) while sitting outside a few hours ago, while smoking a cigarette and a joint. He started to feel like he was possibly "too high" and then began to feel very unwell. Patient stood up and tried to go inside to his wife but fell to the ground, thinks he passed out, came to and crawled to the door to get his wife's attention. Not on thinners, reports a minor abrasion to his left knee, no other injuries, did not hit head. Wife found patient outside, alert, cool to the touch and diaphoretic, and called 911.  The fire department arrived and assessed the patient however it was going to take too long for transport unit to arrive so they helped patient to his vehicle for his wife to drive him to the ER.  Patient lying in bed with rigors currently. Denies chest pain, SHOB, nausea, vomiting, recent illness.  Reports he does have his baseline smoker's cough without any new changes to this cough.  Past medical history of MI a few years ago (no specifics on record, unable to recall any details), chronic hep C in remission. Does smoke marijuana, was smoking just prior to onset of symptoms. Does not drink alcohol.   Reports similar episode that happened a few years ago, diagnosed with vasovagal syncope.  Complaining of room spinning dizziness and rigors on presentation..  Rigors intermittent at time of exam, otherwise nontoxic appearance      Home Medications Prior to Admission medications   Medication Sig Start Date End Date Taking? Authorizing Provider  cyclobenzaprine (FLEXERIL) 10 MG tablet Take 1 tablet (10 mg total) by mouth 2 (two) times daily as needed for muscle spasms. 06/18/18   Robinson, [place] N, PA-C  GABAPENTIN PO Take 1  capsule by mouth daily.     [provider]  ibuprofen (ADVIL,MOTRIN) 600 MG tablet Take 1 tablet (600 mg total) by mouth every 6 (six) hours as needed. 07/25/18   Law, Waylan Boga, PA-C  methocarbamol (ROBAXIN) 500 MG tablet Take 1 tablet (500 mg total) by mouth 2 (two) times daily. 07/25/18   Law, Waylan Boga, PA-C  omeprazole (PRILOSEC) 40 MG capsule Take 40 mg by mouth daily. 04/20/17   [provider]  ondansetron (ZOFRAN ODT) 4 MG disintegrating tablet Take 1 tablet (4 mg total) by mouth every 8 (eight) hours as needed for nausea or vomiting. 07/25/18   McDonald, Mia A, PA-C  oxyCODONE-acetaminophen (PERCOCET/ROXICET) 5-325 MG tablet Take 1-2 tablets by mouth every 6 (six) hours as needed for severe pain. 07/25/18   Law, Waylan Boga, PA-C      Allergies    Ultram Marcia Brash hcl]    Review of Systems   Review of Systems  Constitutional:  Positive for chills and diaphoresis. Negative for fever.  Respiratory:  Negative for cough and shortness of breath.   Cardiovascular:  Negative for chest pain.  Gastrointestinal:  Negative for abdominal pain, constipation, diarrhea, nausea and vomiting.  Musculoskeletal:  Negative for arthralgias and myalgias.  Skin:  Negative for rash and wound.  Allergic/Immunologic: Negative for immunocompromised state.  Neurological:  Positive for dizziness and syncope.  Hematological:  Does not bruise/bleed easily.  Psychiatric/Behavioral:  Negative for confusion.  All other systems reviewed and are negative.  Physical Exam Updated Vital Signs BP 106/66 (BP Location: Right Arm)    Pulse 75    Temp 98 F (36.7 C) (Oral)    Resp 18    Ht 6' (1.829 m)    Wt 96.3 kg    SpO2 99%    BMI 28.79 kg/m  Physical Exam Vitals and nursing note reviewed.  Constitutional:      General: He is not in acute distress.    Appearance: He is well-developed. He is not diaphoretic.     Comments: Intermittent rigors lasting few minutes at a time with resolution in  between episodes.  HENT:     Head: Normocephalic and atraumatic.     Nose: Nose normal.     Mouth/Throat:     Mouth: Mucous membranes are moist.  Eyes:     Extraocular Movements: Extraocular movements intact.     Pupils: Pupils are equal, round, and reactive to light.  Cardiovascular:     Rate and Rhythm: Normal rate and regular rhythm.     Heart sounds: Normal heart sounds.  Pulmonary:     Effort: Pulmonary effort is normal.     Breath sounds: Normal breath sounds.  Abdominal:     Palpations: Abdomen is soft.     Tenderness: There is no abdominal tenderness.  Musculoskeletal:        General: No swelling or tenderness. Normal range of motion.     Cervical back: Neck supple. No tenderness.  Skin:    General: Skin is warm and dry.     Findings: No erythema or rash.  Neurological:     Mental Status: He is alert and oriented to person, place, and time.  Psychiatric:        Behavior: Behavior normal.    ED Results / Procedures / Treatments   Labs (all labs ordered are listed, but only abnormal results are displayed) Labs Reviewed  BASIC METABOLIC PANEL - Abnormal; Notable for the following components:      Result Value   Potassium 3.3 (*)    Glucose, Bld 128 (*)    Creatinine, Ser 1.27 (*)    All other components within normal limits  RESP PANEL BY RT-PCR (FLU A&B, COVID) ARPGX2  CBC  URINALYSIS, ROUTINE W REFLEX MICROSCOPIC  RAPID URINE DRUG SCREEN, HOSP PERFORMED  TROPONIN I (HIGH SENSITIVITY)    EKG EKG Interpretation  Date/Time:  Tuesday October 18 2021 22:09:42 EST Ventricular Rate:  75 PR Interval:  165 QRS Duration: 108 QT Interval:  387 QTC Calculation: 433 R Axis:   60 Text Interpretation: Sinus rhythm Probable left atrial enlargement RSR' in V1 or V2, probably normal variant ST elev, probable normal early repol pattern Artifact in lead(s) I III aVR aVL V2 Confirmed by Marianna Fuss ([id number]) on 10/18/2021 10:38:14 PM  Radiology DG Chest Port 1  View  Result Date: 10/18/2021 CLINICAL DATA:  Syncope, weakness EXAM: PORTABLE CHEST 1 VIEW COMPARISON:  07/25/2018 FINDINGS: Heart and mediastinal contours are within normal limits. No focal opacities or effusions. No acute bony abnormality. IMPRESSION: No active disease. Electronically Signed   By: Charlett Nose M.D.   On: 10/18/2021 22:48    Procedures Procedures    Medications Ordered in ED Medications  potassium chloride SA (KLOR-CON M) CR tablet 40 mEq (has no administration in time range)  sodium chloride 0.9 % bolus 1,000 mL (1,000 mLs Intravenous New Bag/Given 10/18/21 2236)  LORazepam (ATIVAN) injection 1 mg (1  mg Intravenous Given 10/18/21 2248)    ED Course/ Medical Decision Making/ A&P                           Medical Decision Making Amount and/or Complexity of Data Reviewed Labs: ordered.   This patient presents to the ED for concern of syncope, rigors, dizziness, this involves an extensive number of treatment options, and is a complaint that carries with it a high risk of complications and morbidity.  The differential diagnosis includes but not limited to arrhythmia, pneumonia, electrolyte disturbance, substance abuse   Co morbidities that complicate the patient evaluation  Question of prior MI, chronic hepatitis C in remission   Additional history obtained:  Additional history obtained from wife at bedside External records from outside source obtained and reviewed including wellness exam to PCP 08/05/2021   Lab Tests:  I Ordered, and personally interpreted labs.  The pertinent results include:  BMP with mild hypokalemia at 3.3, given oral replacement. CBC normal.    Imaging Studies ordered:  I ordered imaging studies including chest x-ray I independently visualized and interpreted imaging which showed no active disease I agree with the radiologist interpretation   Cardiac Monitoring:  The patient was maintained on a cardiac monitor.  I personally  viewed and interpreted the cardiac monitored which showed an underlying rhythm of: Sinus rhythm, rate 71   Medicines ordered and prescription drug management:  I ordered medication including Ativan for rigors, IV fluids Reevaluation of the patient after these medicines showed that the patient  pending reeval.   Problem List / ED Course:  56 year old male with report of syncope, room spinning dizziness, rigors.  Rigors intermittent at time of exam, otherwise, non toxic in appearance.  Patient was given IV fluids and ativan, labs ordered for further workup. Care signed out a change of shift pending troponin, UDS, COVID/flu and reassessment.           Final Clinical Impression(s) / ED Diagnoses Final diagnoses:  Syncope, unspecified syncope type  Hypokalemia    Rx / DC Orders ED Discharge Orders     None         Jeannie FendMurphy, Zacharias Ridling A, PA-C 10/18/21 2302    Derwood KaplanNanavati, Ankit, MD 10/19/21 [phone]

## 2021-10-18 NOTE — ED Notes (Signed)
Pt reports shaking of legs and states he cant stop shaking.

## 2021-10-18 NOTE — ED Notes (Signed)
ED Provider at bedside. 

## 2021-10-19 LAB — URINALYSIS, ROUTINE W REFLEX MICROSCOPIC
Bilirubin Urine: NEGATIVE
Glucose, UA: NEGATIVE mg/dL
Hgb urine dipstick: NEGATIVE
Ketones, ur: NEGATIVE mg/dL
Leukocytes,Ua: NEGATIVE
Nitrite: NEGATIVE
Protein, ur: NEGATIVE mg/dL
Specific Gravity, Urine: >=1.03 (ref 1.005–1.030)
pH: 5.5 (ref 5.0–8.0)

## 2021-10-19 LAB — RAPID URINE DRUG SCREEN, HOSP PERFORMED
Amphetamines: NOT DETECTED
Barbiturates: NOT DETECTED
Benzodiazepines: NOT DETECTED
Cocaine: NOT DETECTED
Opiates: NOT DETECTED
Tetrahydrocannabinol: POSITIVE — AB

## 2021-10-19 LAB — TROPONIN I (HIGH SENSITIVITY): Troponin I (High Sensitivity): 3 ng/L (ref <18)

## 2021-10-19 NOTE — Discharge Instructions (Addendum)
We saw you in the ER after fainting spell. All the results in the ER are normal, labs and imaging. We are not sure what is causing your symptoms, but suspect ill effects from toxins. The workup in the ER is not complete, and is limited to screening for life threatening and emergent conditions only, so please see a primary care doctor for further evaluation.

## 2022-09-08 IMAGING — DX DG CHEST 1V PORT
1 series · 1 of 1 positions shown · non-contrast
Comparison: 07/25/2018

CLINICAL DATA: Syncope, weakness

EXAM:
PORTABLE CHEST 1 VIEW

[chest ap]
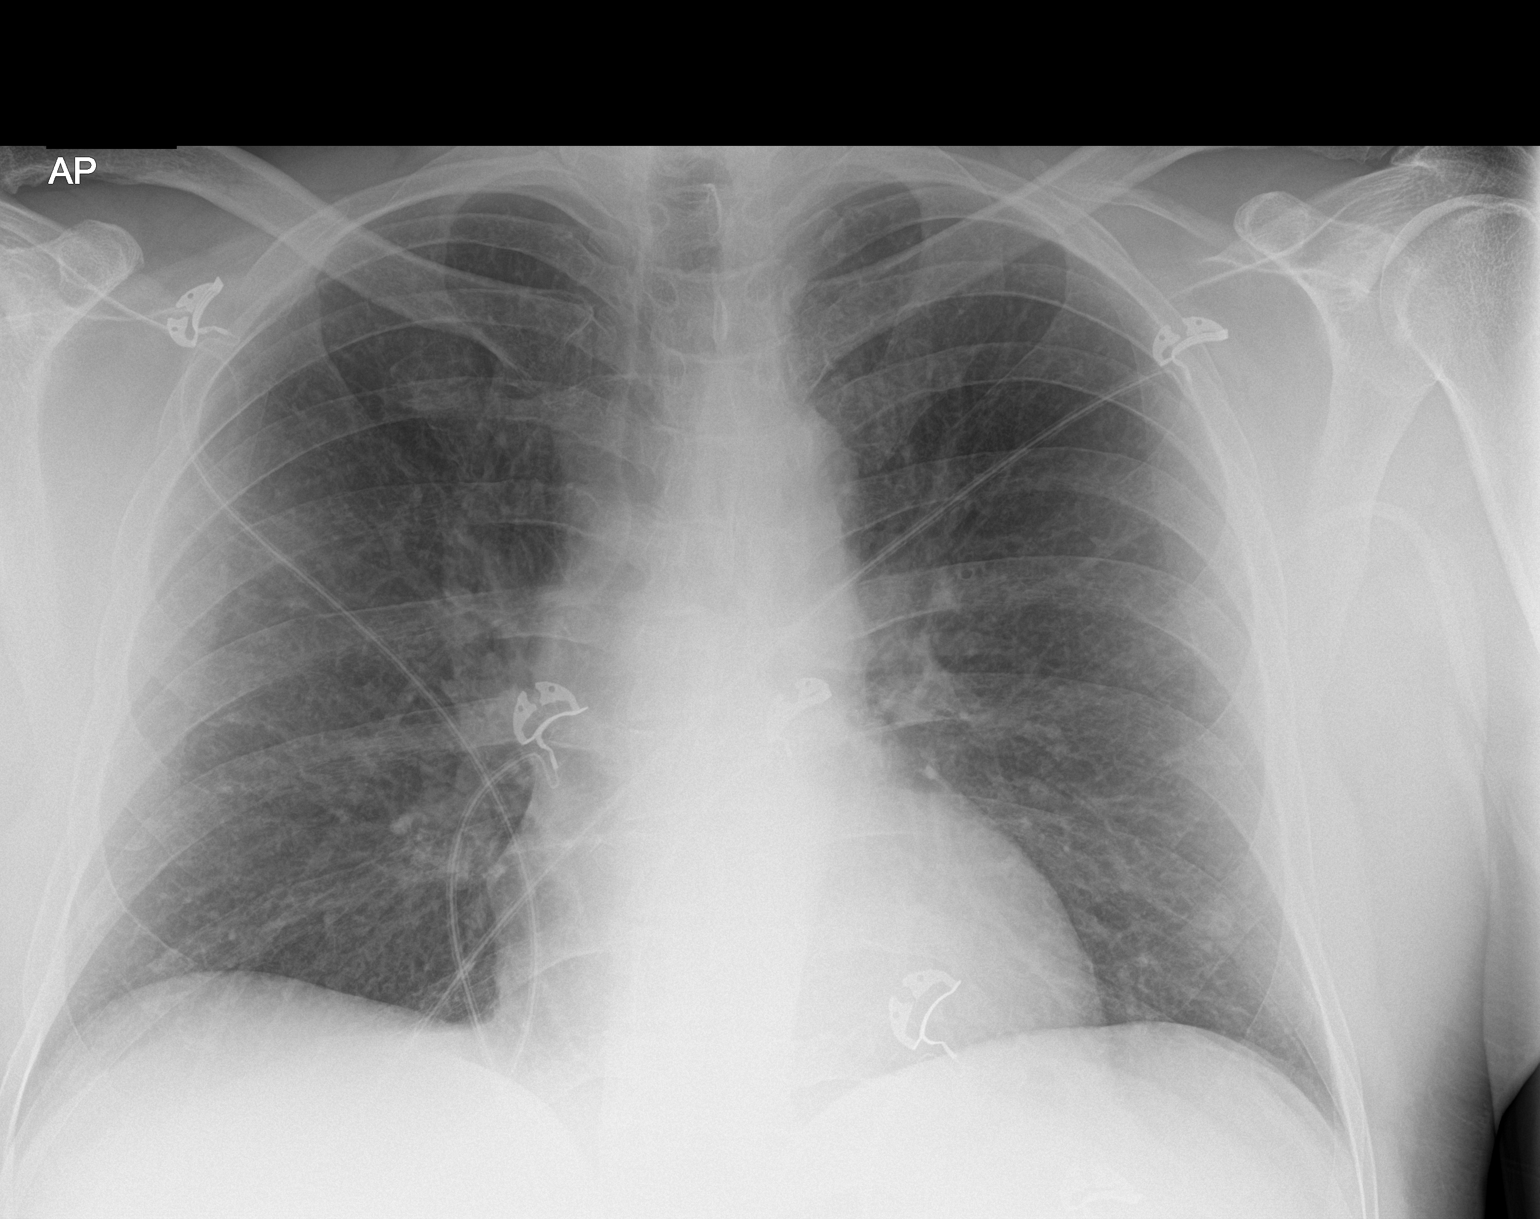

[1 of 1 positions shown; findings below may reference images not displayed]

FINDINGS: Heart and mediastinal contours are within normal limits. No focal
opacities or effusions. No acute bony abnormality.
IMPRESSION: No active disease.
